# Patient Record
Sex: Male | Born: 1970 | Race: Black or African American | Hispanic: No | Marital: Single | Smoking: Current every day smoker
Health system: Southern US, Community
[De-identification: ages and names within clinical notes are randomized; demographics above are authoritative.]

## PROBLEM LIST (undated history)

## (undated) DIAGNOSIS — F1011 Alcohol abuse, in remission: Secondary | ICD-10-CM

## (undated) DIAGNOSIS — I10 Essential (primary) hypertension: Secondary | ICD-10-CM

---

## 1999-08-18 ENCOUNTER — Emergency Department (HOSPITAL_COMMUNITY): Admission: EM | Admit: 1999-08-18 | Discharge: 1999-08-18 | Payer: Self-pay | Admitting: Emergency Medicine

## 1999-08-18 ENCOUNTER — Encounter: Payer: Self-pay | Admitting: Emergency Medicine

## 2000-09-06 ENCOUNTER — Emergency Department (HOSPITAL_COMMUNITY): Admission: EM | Admit: 2000-09-06 | Discharge: 2000-09-06 | Payer: Self-pay | Admitting: Emergency Medicine

## 2001-12-01 ENCOUNTER — Emergency Department (HOSPITAL_COMMUNITY): Admission: EM | Admit: 2001-12-01 | Discharge: 2001-12-01 | Payer: Self-pay

## 2002-02-05 ENCOUNTER — Encounter: Payer: Self-pay | Admitting: Emergency Medicine

## 2002-02-05 ENCOUNTER — Emergency Department (HOSPITAL_COMMUNITY): Admission: EM | Admit: 2002-02-05 | Discharge: 2002-02-05 | Payer: Self-pay | Admitting: Emergency Medicine

## 2008-08-19 ENCOUNTER — Emergency Department (HOSPITAL_COMMUNITY): Admission: EM | Admit: 2008-08-19 | Discharge: 2008-08-19 | Payer: Self-pay | Admitting: Emergency Medicine

## 2011-02-02 ENCOUNTER — Emergency Department (HOSPITAL_COMMUNITY): Payer: Self-pay

## 2011-02-02 ENCOUNTER — Emergency Department (HOSPITAL_COMMUNITY)
Admission: EM | Admit: 2011-02-02 | Discharge: 2011-02-03 | Disposition: A | Discharge status: Home / Self Care | Payer: Self-pay | Attending: Emergency Medicine | Admitting: Emergency Medicine

## 2011-02-02 ENCOUNTER — Encounter (HOSPITAL_COMMUNITY): Payer: Self-pay

## 2011-02-02 DIAGNOSIS — R1012 Left upper quadrant pain: Secondary | ICD-10-CM | POA: Insufficient documentation

## 2011-02-02 DIAGNOSIS — I10 Essential (primary) hypertension: Secondary | ICD-10-CM | POA: Insufficient documentation

## 2011-02-02 DIAGNOSIS — R109 Unspecified abdominal pain: Secondary | ICD-10-CM | POA: Insufficient documentation

## 2011-02-02 HISTORY — DX: Essential (primary) hypertension: I10

## 2011-02-02 LAB — DIFFERENTIAL
Basophils Absolute: 0 10*3/uL (ref 0.0–0.1)
Basophils Relative: 0 % (ref 0–1)
Eosinophils Absolute: 0.1 10*3/uL (ref 0.0–0.7)
Eosinophils Relative: 1 % (ref 0–5)
Lymphocytes Relative: 32 % (ref 12–46)
Lymphs Abs: 2.5 10*3/uL (ref 0.7–4.0)
Monocytes Absolute: 0.9 10*3/uL (ref 0.1–1.0)
Monocytes Relative: 12 % (ref 3–12)
Neutro Abs: 4.3 10*3/uL (ref 1.7–7.7)
Neutrophils Relative %: 55 % (ref 43–77)

## 2011-02-02 LAB — COMPREHENSIVE METABOLIC PANEL
Albumin: 3.9 g/dL (ref 3.5–5.2)
Alkaline Phosphatase: 88 U/L (ref 39–117)
BUN: 8 mg/dL (ref 6–23)
Chloride: 103 mEq/L (ref 96–112)
Creatinine, Ser: 1.06 mg/dL (ref 0.4–1.5)
GFR calc non Af Amer: >60 mL/min (ref >60)
Glucose, Bld: 104 mg/dL — ABNORMAL HIGH (ref 70–99)
Potassium: 3.8 mEq/L (ref 3.5–5.1)
Total Bilirubin: 0.5 mg/dL (ref 0.3–1.2)

## 2011-02-02 LAB — CBC
HCT: 46.8 % (ref 39.0–52.0)
Hemoglobin: 16.4 g/dL (ref 13.0–17.0)
MCH: 32 pg (ref 26.0–34.0)
MCHC: 35 g/dL (ref 30.0–36.0)
MCV: 91.4 fL (ref 78.0–100.0)
Platelets: 219 10*3/uL (ref 150–400)
RBC: 5.12 MIL/uL (ref 4.22–5.81)
RDW: 13.4 % (ref 11.5–15.5)
WBC: 7.7 10*3/uL (ref 4.0–10.5)

## 2011-02-02 LAB — URINALYSIS, ROUTINE W REFLEX MICROSCOPIC
Ketones, ur: 15 mg/dL — AB
Nitrite: NEGATIVE
Protein, ur: NEGATIVE mg/dL
Urobilinogen, UA: 1 mg/dL (ref 0.0–1.0)

## 2011-02-02 LAB — LIPASE, BLOOD: Lipase: 45 U/L (ref 11–59)

## 2011-12-17 ENCOUNTER — Encounter (HOSPITAL_COMMUNITY): Payer: Self-pay | Admitting: Emergency Medicine

## 2011-12-17 DIAGNOSIS — Z0389 Encounter for observation for other suspected diseases and conditions ruled out: Secondary | ICD-10-CM | POA: Insufficient documentation

## 2011-12-17 NOTE — ED Notes (Signed)
PT. REPORTS ELEVATE BLOOD PRESSURE AND LEFT FACE SWELLING / PAIN LAST NIGHT , RAN OUT OF HYPERTENSION MEDICATIONS .

## 2011-12-18 ENCOUNTER — Emergency Department (HOSPITAL_COMMUNITY)
Admission: EM | Admit: 2011-12-18 | Discharge: 2011-12-18 | Discharge status: Against Medical Advice | Payer: Self-pay | Attending: Emergency Medicine | Admitting: Emergency Medicine

## 2013-06-17 ENCOUNTER — Emergency Department (HOSPITAL_COMMUNITY)
Admission: EM | Admit: 2013-06-17 | Discharge: 2013-06-17 | Disposition: A | Discharge status: Home / Self Care | Payer: Self-pay | Attending: Emergency Medicine | Admitting: Emergency Medicine

## 2013-06-17 ENCOUNTER — Emergency Department (HOSPITAL_COMMUNITY): Payer: Self-pay

## 2013-06-17 ENCOUNTER — Encounter (HOSPITAL_COMMUNITY): Payer: Self-pay | Admitting: Emergency Medicine

## 2013-06-17 DIAGNOSIS — I10 Essential (primary) hypertension: Secondary | ICD-10-CM | POA: Insufficient documentation

## 2013-06-17 DIAGNOSIS — I160 Hypertensive urgency: Secondary | ICD-10-CM

## 2013-06-17 DIAGNOSIS — Z79899 Other long term (current) drug therapy: Secondary | ICD-10-CM | POA: Insufficient documentation

## 2013-06-17 DIAGNOSIS — R112 Nausea with vomiting, unspecified: Secondary | ICD-10-CM | POA: Insufficient documentation

## 2013-06-17 DIAGNOSIS — F17200 Nicotine dependence, unspecified, uncomplicated: Secondary | ICD-10-CM | POA: Insufficient documentation

## 2013-06-17 LAB — CBC WITH DIFFERENTIAL/PLATELET
Basophils Absolute: 0 10*3/uL (ref 0.0–0.1)
Basophils Relative: 0 % (ref 0–1)
HCT: 47.3 % (ref 39.0–52.0)
Lymphocytes Relative: 21 % (ref 12–46)
MCHC: 35.1 g/dL (ref 30.0–36.0)
Monocytes Absolute: 0.8 10*3/uL (ref 0.1–1.0)
Neutro Abs: 5.7 10*3/uL (ref 1.7–7.7)
Neutrophils Relative %: 69 % (ref 43–77)
Platelets: 181 10*3/uL (ref 150–400)
RDW: 12.9 % (ref 11.5–15.5)
WBC: 8.3 10*3/uL (ref 4.0–10.5)

## 2013-06-17 LAB — URINALYSIS, ROUTINE W REFLEX MICROSCOPIC
Bilirubin Urine: NEGATIVE
Glucose, UA: NEGATIVE mg/dL
Hgb urine dipstick: NEGATIVE
Specific Gravity, Urine: 1.019 (ref 1.005–1.030)

## 2013-06-17 LAB — COMPREHENSIVE METABOLIC PANEL
ALT: 55 U/L — ABNORMAL HIGH (ref 0–53)
AST: 31 U/L (ref 0–37)
Albumin: 4.2 g/dL (ref 3.5–5.2)
Chloride: 99 mEq/L (ref 96–112)
Creatinine, Ser: 1.09 mg/dL (ref 0.50–1.35)
Sodium: 138 mEq/L (ref 135–145)
Total Bilirubin: 1 mg/dL (ref 0.3–1.2)

## 2013-06-17 LAB — LIPASE, BLOOD: Lipase: 32 U/L (ref 11–59)

## 2013-06-17 MED ORDER — HYDROCHLOROTHIAZIDE 25 MG PO TABS: 25.0000 mg | ORAL_TABLET | Freq: Every day | ORAL | Status: DC

## 2013-06-17 NOTE — ED Provider Notes (Signed)
History    CSN: [id number] Arrival date & time 06/17/13  1552  First MD Initiated Contact with Patient 06/17/13 1739     Chief Complaint  Patient presents with  . Emesis  . Headache    HPI  Patient presents with concerns of nausea, vomiting, headache, elevated blood pressure. He states that he has a history of hypertension, for which she is not currently taking his medication. He states that over the past days and weeks he has had multiple changes in his condition. Respiratory the patient is new nausea, headache that began approximately 3 days ago, subtly. Since onset symptoms have been persistent, without appreciable change from anything. The headache is right-sided, sore, nonradiating. There is no report of new disorientation, confusion, chest pain, dyspnea, syncope, near syncope. The patient did have multiple episodes of emesis 3 days ago, but none since that time.   Past Medical History  Diagnosis Date  . Hypertension    History reviewed. No pertinent past surgical history. History reviewed. No pertinent family history. History  Substance Use Topics  . Smoking status: Current Every Day Smoker  . Smokeless tobacco: Not on file  . Alcohol Use: Yes    Review of Systems  Constitutional:       Per HPI, otherwise negative  HENT:       Per HPI, otherwise negative  Respiratory:       Per HPI, otherwise negative  Cardiovascular:       Per HPI, otherwise negative  Gastrointestinal: Positive for nausea and vomiting.  Endocrine:       Negative aside from HPI  Genitourinary:       Neg aside from HPI   Musculoskeletal:       Per HPI, otherwise negative  Skin: Negative.   Neurological: Negative for syncope.    Allergies  Review of patient's allergies indicates no known allergies.  Home Medications   Current Outpatient Rx  Name  Route  Sig  Dispense  Refill  . ibuprofen (ADVIL,MOTRIN) 200 MG tablet   Oral   Take 200 mg by mouth every 6 (six) hours as needed for  pain.         . naproxen sodium (ANAPROX) 220 MG tablet   Oral   Take 220 mg by mouth 2 (two) times daily with a meal.         . hydrochlorothiazide (HYDRODIURIL) 25 MG tablet   Oral   Take 1 tablet (25 mg total) by mouth daily.   30 tablet   0    BP 143/93  Pulse 82  Temp(Src) 98.7 F (37.1 C) (Oral)  Resp 18  SpO2 97% Physical Exam  Nursing note and vitals reviewed. Constitutional: He is oriented to person, place, and time. He appears well-developed. No distress.  HENT:  Head: Normocephalic and atraumatic.  Eyes: Conjunctivae and EOM are normal.  Cardiovascular: Normal rate and regular rhythm.   Pulmonary/Chest: Effort normal. No stridor. No respiratory distress.  Abdominal: He exhibits no distension. There is no tenderness.  Musculoskeletal: He exhibits no edema.  Neurological: He is alert and oriented to person, place, and time.  Skin: Skin is warm and dry.  Psychiatric: He has a normal mood and affect.    ED Course  Procedures (including critical care time) Labs Reviewed  COMPREHENSIVE METABOLIC PANEL - Abnormal; Notable for the following:    Glucose, Bld 104 (*)    ALT 55 (*)    GFR calc non Af Amer 81 (*)  All other components within normal limits  URINALYSIS, ROUTINE W REFLEX MICROSCOPIC - Abnormal; Notable for the following:    APPearance CLOUDY (*)    All other components within normal limits  CBC WITH DIFFERENTIAL  LIPASE, BLOOD   Dg Chest 2 View  06/17/2013   *RADIOLOGY REPORT*  Clinical Data: Hypertensive crisis.  Nausea, vomiting.  Cough, congestion.  CHEST - 2 VIEW  Comparison: 02/02/2011  Findings: Heart and mediastinal contours are within normal limits. No focal opacities or effusions.  No acute bony abnormality.  IMPRESSION: No active cardiopulmonary disease.   Original Report Authenticated By: Charlett Nose, M.D.   1. Hypertensive urgency    Date: 06/17/2013  Rate: 83  Rhythm: normal sinus rhythm  QRS Axis: normal  Intervals: normal  ST/T  Wave abnormalities: nonspecific T wave changes  Conduction Disutrbances:left posterior fascicular block  Narrative Interpretation:   Old EKG Reviewed: none available ABNORMAL  7:52 PM We discussed all results, the need to initiate antihypertensive, and specifically the need to follow up with a physician. MDM  This generally well-appearing male presents with concern of hypertension, headache, nausea.  On exam he is neurologically intact, in no distress.  With his elevated blood pressure, there is some suspicion for hypertensive crisis, though labs, vital signs are somewhat reassuring.  The patient was started on blood pressure medication, and absent any evidence of distress, with appropriate for discharge with close outpatient followup.  Gerhard Munch, MD 06/17/13 [phone]

## 2013-06-17 NOTE — ED Notes (Signed)
[place]wood MD at bedside. 

## 2013-06-17 NOTE — ED Notes (Signed)
Pt c/o N/V, HA and abd pain x 2 days; pt sts out of BP meds x a long time

## 2014-07-20 ENCOUNTER — Encounter (HOSPITAL_COMMUNITY): Payer: Self-pay | Admitting: Emergency Medicine

## 2014-07-20 ENCOUNTER — Emergency Department (HOSPITAL_COMMUNITY)
Admission: EM | Admit: 2014-07-20 | Discharge: 2014-07-21 | Disposition: A | Discharge status: Home / Self Care | Payer: No Typology Code available for payment source | Attending: Emergency Medicine | Admitting: Emergency Medicine

## 2014-07-20 DIAGNOSIS — M79632 Pain in left forearm: Secondary | ICD-10-CM

## 2014-07-20 DIAGNOSIS — F17200 Nicotine dependence, unspecified, uncomplicated: Secondary | ICD-10-CM | POA: Insufficient documentation

## 2014-07-20 DIAGNOSIS — IMO0002 Reserved for concepts with insufficient information to code with codable children: Secondary | ICD-10-CM | POA: Insufficient documentation

## 2014-07-20 DIAGNOSIS — S4980XA Other specified injuries of shoulder and upper arm, unspecified arm, initial encounter: Secondary | ICD-10-CM | POA: Insufficient documentation

## 2014-07-20 DIAGNOSIS — M545 Low back pain, unspecified: Secondary | ICD-10-CM

## 2014-07-20 DIAGNOSIS — Y92410 Unspecified street and highway as the place of occurrence of the external cause: Secondary | ICD-10-CM | POA: Insufficient documentation

## 2014-07-20 DIAGNOSIS — S59919A Unspecified injury of unspecified forearm, initial encounter: Secondary | ICD-10-CM

## 2014-07-20 DIAGNOSIS — S46909A Unspecified injury of unspecified muscle, fascia and tendon at shoulder and upper arm level, unspecified arm, initial encounter: Secondary | ICD-10-CM | POA: Insufficient documentation

## 2014-07-20 DIAGNOSIS — S59909A Unspecified injury of unspecified elbow, initial encounter: Secondary | ICD-10-CM | POA: Insufficient documentation

## 2014-07-20 DIAGNOSIS — I10 Essential (primary) hypertension: Secondary | ICD-10-CM | POA: Insufficient documentation

## 2014-07-20 DIAGNOSIS — S6990XA Unspecified injury of unspecified wrist, hand and finger(s), initial encounter: Secondary | ICD-10-CM

## 2014-07-20 DIAGNOSIS — S43401A Unspecified sprain of right shoulder joint, initial encounter: Secondary | ICD-10-CM

## 2014-07-20 DIAGNOSIS — Y9389 Activity, other specified: Secondary | ICD-10-CM | POA: Insufficient documentation
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
## 2014-07-21 ENCOUNTER — Emergency Department (HOSPITAL_COMMUNITY): Payer: No Typology Code available for payment source

## 2014-07-21 MED ORDER — KETOROLAC TROMETHAMINE 30 MG/ML IJ SOLN
30.0000 mg | Freq: Once | INTRAMUSCULAR | Status: AC
  Administered 2014-07-21: 30 mg via INTRAMUSCULAR
  Filled 2014-07-21: qty 1

## 2014-07-21 NOTE — Discharge Instructions (Signed)
Return to the emergency room with worsening of symptoms or with symptoms that are concerning. Shoulder pain. Tylenol 2 500mg  tablets three times a day. Do not take more tylenol than this. You may add NSAIDs (motrin, ibuprofen, naproxen, aleve). Due to your stomach upset and diarrhea try taking with food. RICE: Rest, Ice, compression, elevation. Follow up with urgent care or PCP. Since patient does not have PCP, resources provided.   Emergency Department Resource Guide 1) Find a Doctor and Pay Out of Pocket Although you won't have to find out who is covered by your insurance plan, it is a good idea to ask around and get recommendations. You will then need to call the office and see if the doctor you have chosen will accept you as a new patient and what types of options they offer for patients who are self-pay. Some doctors offer discounts or will set up payment plans for their patients who do not have insurance, but you will need to ask so you aren't surprised when you get to your appointment.  2) Contact Your Local Health Department Not all health departments have doctors that can see patients for sick visits, but many do, so it is worth a call to see if yours does. If you don't know where your local health department is, you can check in your phone book. The CDC also has a tool to help you locate your state's health department, and many state websites also have listings of all of their local health departments.  3) Find a Walk-in Clinic If your illness is not likely to be very severe or complicated, you may want to try a walk in clinic. These are popping up all over the country in pharmacies, drugstores, and shopping centers. They're usually staffed by nurse practitioners or physician assistants that have been trained to treat common illnesses and complaints. They're usually fairly quick and inexpensive. However, if you have serious medical issues or chronic medical problems, these are probably not  your best option.  No Primary Care Doctor: - Call Health Connect at  [phone] - they can help you locate a primary care doctor that  accepts your insurance, provides certain services, etc. - Physician Referral Service- [phone]  Chronic Pain Problems: Organization         Address  Phone   Notes  [facility]  [phone] Patients need to be referred by their primary care doctor.   Medication Assistance: Organization         Address  Phone   Notes  [facility] Medication [facility] [address] [place] [place], [place] [address] [phone] --Must be a resident of [facility] -- Must have NO insurance coverage whatsoever (no Medicaid/ Medicare, etc.) -- The pt. MUST have a primary care doctor that directs their care regularly and follows them in the community   [facility]  [phone]   [employer]  [phone]    Agencies that provide inexpensive medical care: Organization         Address  Phone   Notes  [facility]  [phone]   [facility]    [phone]   [facility] [address] [place], [place] [address] [phone]   [facility] [address] [place] [place] [phone]   Planned Parenthood    [phone]   [facility]    [phone]   [facility]  [address], [place] Phone:  [phone], Fax:  [phone](336) [phone] Hours of Operation:  9 am - 6 pm, M-F.  Also accepts Medicaid/Medicare and self-pay.  [facility]  [address], [place] Phone: [phone](336) [phone], Fax: [phone]) [phone]. Hours of Operation:  8:30 am - 5:30 pm, M-F.  Also accepts Medicaid and self-pay.  [facility] [place] [address], [place] Phone: [phone](336) [phone]   [facility] [address], [place], [place] [phone], Ext. [phone] Mondays & Thursdays: 7-9 AM.  First  15 patients are seen on a first come, first serve basis.    Medicaid-accepting [facility] Providers:  Organization         Address  Phone   Notes  [facility] [address], [place] [phone] Also accepts self-pay patients.  [facility] [address] [place], [place]  [phone]) [phone]   [facility] [address], [place] [phone]   [facility] [address] [place] [address], [place] [phone]) [phone]   Renaye RakersVeita Bland [address], [place]   [phone] Only accepts [place] Access [place] patients after they have their name applied to their card.   Self-Pay (no insurance) in [facility]:  Organization         Address  Phone   Notes  Sickle Cell Patients, [facility] [address], [place] [phone]) [phone]   [facility] [address], [place] [phone]   [facility]  [address], [place] [phone]) [phone]   [facility]/Dr. Osei-Bonsu  [address] [place] [address] [place] [address], [place] or [phone], [place] [phone]) [phone] Phone number for both [place] and [place] locations is the same.  [facility] [address], [place] [phone]) [phone]   [facility] [place] [address] [place] [address], [place] or [address] [phone]   [facility] [address], [place] [phone](336) [phone], phone; [phone](336) [phone], fax Sees patients 1st and 3rd Saturday of every month.  Must not qualify for public or private insurance (i.e. Medicaid, Medicare, [place] Health Choice, Veterans' Benefits)  Household income should be no more than 200% of the poverty level The clinic cannot treat you if you are pregnant or think you are pregnant  Sexually transmitted diseases are not treated at the clinic.    Dental  Care: Organization         Address  Phone  Notes  [facility] [address], [place] [phone]) [phone] Accepts children up to age 45 who are enrolled in [place] or [place] Health Choice; pregnant women with a Medicaid card; and children who have applied for Medicaid or [place] Health Choice, but were declined, whose parents can pay a reduced fee at time of service.  [facility] [place]  [address], [place] [phone]) [phone] Accepts children up to age 45 who are enrolled in [place] or [place] Health Choice; pregnant women with a Medicaid card; and children who have applied for Medicaid or [place] Health Choice, but were declined, whose parents can pay a reduced fee at time of service.  [facility]  [address],  [place] [phone] Patients are seen by appointment only. Walk-ins are not accepted. [place] will see patients 51 years of age and older. Monday - Tuesday (8am-5pm) Most Wednesdays (8:30-5pm) $30 per visit, cash only  [facility]  [address], [facility] [phone] Patients are seen by appointment only. Walk-ins are not accepted. [place] will see patients 14 years of age and older. One Wednesday Evening (Monthly: Volunteer Based).  $30 per visit, cash only  [place]  [phone] for adults; Children under age 72, call Graduate Pediatric Dentistry at [phone]. Children aged 20-14, please call [phone] to request a pediatric application.  Dental services are provided in all areas of dental care including fillings, crowns and bridges, complete and partial dentures, implants, gum treatment, root canals, and extractions. Preventive care is also provided. Treatment is provided to both adults and children. Patients are selected via a lottery and there is often a waiting list.   [facility] [address], [place]  [phone] [email or web address]   [facility] [address] [place], [place] [phone], Ext. [phone] Second and Fourth Thursday of each month, opens at 6:30 AM; Clinic ends at 9 AM.  Patients are seen on a first-come first-served basis, and a limited number are seen during each clinic.   [facility]  [address] Hillard Danker [place], [place] [phone]   Eligibility Requirements You must have lived in [place], [place], or [place] counties for at least the last three months.   You cannot be eligible for state or federal sponsored [employer], including [employer], [place], or [employer].   You generally cannot be eligible for healthcare insurance through your employer.    How to apply: Eligibility screenings are held every Tuesday and Wednesday afternoon from 1:00 pm until 4:00 pm. You do not need an appointment for the interview!  [facility] [address] [place], [place]   [place]  [place] Department  [place]  [phone]    Behavioral Health Resources in the Community: Intensive Outpatient Programs Organization         Address  Phone  Notes  Jewett Whittemore. [address] [place], [place] [phone]   [facility] [address] [place], [place]   [facility] [address] [place], [place]   [place] [address]  [place], [place] or [phone]   Substance Abuse Resources Organization         Address  Phone  Notes  [facility] Drug Services  [phone]   [place]  [phone]   [facility] [place]   Chinita Pester  [phone]   Residential & Outpatient Substance Abuse Program  [phone])[phone]    Psychological Services Organization         Address  Phone  Notes  [facility] [place]  [place]  [phone]   [place] [address] [place] or [phone]    Mobile Crisis Teams Organization         Address  Phone  Notes  [facility], Mobile Crisis Care Unit  [phone]   [facility]  [address] [place], [place]   [place] [phone]  [address] °[place] [phone]   ° °Self-Help/Support Groups °Organization         Address  Phone             Notes  °[facility] [place] - variety of support groups  336- [phone] Call for more information  °Narcotics Anonymous (NA), [facility] [address], °[place]  2 meetings at this location  ° °Residential Treatment Programs °Organization         Address  Phone  Notes  °[facility] [address],    °[place]  [phone]   °[facility] ° [address], [place], [place] [phone]   °[facility] [address], [place] [phone] Admissions: 8am-3pm M-F  °[facility] [address]    °[place], [place] [phone]   °[facility] [address], [place], [place] [phone]   °[facility] [address]  °[place], [place] [phone]   °[facility] - Intensive Outpatient [address], [place], [place] [phone]   °[facility] [address]  °[place], [place] 1-877-615-2722 or [phone]   °[facility] ([facility]) [address] [place], [place] [phone] Accepts Medicaid  °[facility] [address]  °[place] 1-800-659-3381 Substance Abuse/Addiction Treatment  ° °[place] Behavioral Health Resources °Organization         Address  Phone  Notes  °[facility]  (888) [phone]   °Julie Brannon, PhD [address] [place], [place]   ([phone]) [phone] or (336) 951-0000   °[facility]   [address] °[place], [place] (336) 349-4454   °[facility] [address], [place], [place] (336) [phone] Insurance/Medicaid/sponsorship through [facility]  °[facility] [address]                                    [place], [place] ([phone]) [phone] Therapy/tele-psych/case  °[facility] [address]  ° [place], [place] (336) [phone]    °Dr. Arfeen  (336) [phone]   °[facility] [place]  [employer] [place] [facility] 1) [address],  °2) [address], [place] °3)  [address], [place] (336) [phone] °(336) [phone] ° °(336) [phone]   °[place] Child Abuse Hotline (336) 342-1394 or (336) 342-3537 (After Hours)    ° ° ° °

## 2014-07-21 NOTE — ED Provider Notes (Signed)
Medical screening examination/treatment/procedure(s) were conducted as a shared visit with non-physician practitioner(s) and myself.  I personally evaluated the patient during the encounter  Please see my separate respective documentation pertaining to this patient encounter   Vida RollerBrian D Asher Babilonia, MD 07/21/14 [phone]
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
## 2014-08-04 ENCOUNTER — Encounter (HOSPITAL_COMMUNITY): Payer: Self-pay | Admitting: Emergency Medicine

## 2014-08-04 ENCOUNTER — Emergency Department (HOSPITAL_COMMUNITY)
Admission: EM | Admit: 2014-08-04 | Discharge: 2014-08-04 | Disposition: A | Discharge status: Home / Self Care | Payer: No Typology Code available for payment source | Attending: Emergency Medicine | Admitting: Emergency Medicine

## 2014-08-04 DIAGNOSIS — T6391XA Toxic effect of contact with unspecified venomous animal, accidental (unintentional), initial encounter: Secondary | ICD-10-CM | POA: Diagnosis present

## 2014-08-04 DIAGNOSIS — Y929 Unspecified place or not applicable: Secondary | ICD-10-CM | POA: Insufficient documentation

## 2014-08-04 DIAGNOSIS — I10 Essential (primary) hypertension: Secondary | ICD-10-CM | POA: Insufficient documentation

## 2014-08-04 DIAGNOSIS — F17200 Nicotine dependence, unspecified, uncomplicated: Secondary | ICD-10-CM | POA: Insufficient documentation

## 2014-08-04 DIAGNOSIS — T63441A Toxic effect of venom of bees, accidental (unintentional), initial encounter: Secondary | ICD-10-CM

## 2014-08-04 DIAGNOSIS — Y939 Activity, unspecified: Secondary | ICD-10-CM | POA: Insufficient documentation

## 2014-08-04 DIAGNOSIS — T63461A Toxic effect of venom of wasps, accidental (unintentional), initial encounter: Secondary | ICD-10-CM | POA: Diagnosis not present

## 2014-08-04 MED ORDER — DIPHENHYDRAMINE HCL 50 MG/ML IJ SOLN
25.0000 mg | Freq: Once | INTRAMUSCULAR | Status: AC
  Administered 2014-08-04: 25 mg via INTRAMUSCULAR
  Filled 2014-08-04: qty 1

## 2014-08-04 MED ORDER — OXYCODONE-ACETAMINOPHEN 5-325 MG PO TABS
1.0000 | ORAL_TABLET | Freq: Once | ORAL | Status: AC
  Administered 2014-08-04: 1 via ORAL
  Filled 2014-08-04: qty 1

## 2014-08-04 MED ORDER — PREDNISONE 50 MG PO TABS
50.0000 mg | ORAL_TABLET | Freq: Every day | ORAL | Status: DC
Start: 2014-08-04 — End: 2015-06-07

## 2014-08-04 MED ORDER — PREDNISONE 20 MG PO TABS
60.0000 mg | ORAL_TABLET | Freq: Once | ORAL | Status: AC
  Administered 2014-08-04: 60 mg via ORAL
  Filled 2014-08-04: qty 3

## 2014-08-04 MED ORDER — DEXAMETHASONE SODIUM PHOSPHATE 10 MG/ML IJ SOLN: 10.0000 mg | Freq: Once | INTRAMUSCULAR | Status: DC

## 2014-08-04 MED ORDER — HYDROCODONE-ACETAMINOPHEN 5-325 MG PO TABS: 1.0000 | ORAL_TABLET | Freq: Four times a day (QID) | ORAL | Status: DC | PRN

## 2014-08-04 NOTE — Discharge Instructions (Signed)
Return here as needed.  Take Benadryl at home.  Ice to the area

## 2014-08-04 NOTE — ED Notes (Signed)
Declined W/C at D/C and was escorted to lobby by RN. 

## 2014-08-04 NOTE — ED Notes (Signed)
Pt states he was stung by bee to upper lip yesterday, reports increased swelling and discomfort, respirations unlabored. Speaking complete sentences. No signs of distress noted.

## 2014-08-04 NOTE — ED Provider Notes (Signed)
Medical screening examination/treatment/procedure(s) were performed by non-physician practitioner and as supervising physician I was immediately available for consultation/collaboration.   EKG Interpretation None        Malvin Morrish N Dixon Luczak, DO 08/04/14 1426 

## 2014-08-04 NOTE — ED Notes (Addendum)
Pt sts stung by bee yesterday on upper lip; pt here today with pain and swelling; no distress noted; pt sts hx of htn but denies any htn meds

## 2014-08-04 NOTE — ED Provider Notes (Signed)
CSN: [id number]     Arrival date & time 08/04/14  1137 History  This chart was scribed for non-physician practitioner Ebbie Ridge, PA-C working with Layla Maw Ward, DO by Leone Payor, ED Scribe. This patient was seen in room TR05C/TR05C and the patient's care was started at 1:18 PM.    Chief Complaint  Patient presents with  . Insect Bite   The history is provided by the patient. No language interpreter was used.    HPI Comments: Joseph Brady is a 43 y.o. male who presents to the Emergency Department complaining of a bee sting to left upper lip that occurred yesterday. He now has constant, unchanged swelling and pain to the upper lip. He did not try any home remedies for his symptoms. He denies SOB, oral swelling, difficulty swallowing.   Past Medical History  Diagnosis Date  . Hypertension    History reviewed. No pertinent past surgical history. History reviewed. No pertinent family history. History  Substance Use Topics  . Smoking status: Current Every Day Smoker  . Smokeless tobacco: Not on file  . Alcohol Use: Yes    Review of Systems  Respiratory: Negative for shortness of breath.   Skin:       +bee sting to upper lip  Neurological: Negative for numbness.      Allergies  Review of patient's allergies indicates no known allergies.  Home Medications   Prior to Admission medications   Not on File   BP 151/102  Pulse 95  Temp(Src) 98.9 F (37.2 C) (Oral)  Resp 20  SpO2 97% Physical Exam  Nursing note and vitals reviewed. Constitutional: He is oriented to person, place, and time. He appears well-developed and well-nourished.  HENT:  Head: Normocephalic and atraumatic.  Swelling to upper lip, left greater than right.   Cardiovascular: Normal rate.   Pulmonary/Chest: Effort normal.  Abdominal: He exhibits no distension.  Neurological: He is alert and oriented to person, place, and time.  Skin: Skin is warm and dry.  Psychiatric: He has a normal mood and  affect.    ED Course  Procedures (including critical care time)  DIAGNOSTIC STUDIES: Oxygen Saturation is 97% on RA, adequate by my interpretation.    COORDINATION OF CARE: 1:20 PM Discussed treatment plan with pt at bedside and pt agreed to plan.   I personally performed the services described in this documentation, which was scribed in my presence. The recorded information has been reviewed and is accurate.   Carlyle Dolly, PA-C 08/04/14 1419

## 2014-12-14 ENCOUNTER — Emergency Department (HOSPITAL_COMMUNITY)
Admission: EM | Admit: 2014-12-14 | Discharge: 2014-12-14 | Disposition: A | Discharge status: Home / Self Care | Payer: Self-pay | Attending: Emergency Medicine | Admitting: Emergency Medicine

## 2014-12-14 ENCOUNTER — Encounter (HOSPITAL_COMMUNITY): Payer: Self-pay | Admitting: Emergency Medicine

## 2014-12-14 DIAGNOSIS — F1210 Cannabis abuse, uncomplicated: Secondary | ICD-10-CM | POA: Diagnosis present

## 2014-12-14 DIAGNOSIS — F1994 Other psychoactive substance use, unspecified with psychoactive substance-induced mood disorder: Secondary | ICD-10-CM | POA: Diagnosis present

## 2014-12-14 DIAGNOSIS — Z720 Tobacco use: Secondary | ICD-10-CM | POA: Insufficient documentation

## 2014-12-14 DIAGNOSIS — F1014 Alcohol abuse with alcohol-induced mood disorder: Secondary | ICD-10-CM | POA: Insufficient documentation

## 2014-12-14 DIAGNOSIS — F12188 Cannabis abuse with other cannabis-induced disorder: Secondary | ICD-10-CM | POA: Insufficient documentation

## 2014-12-14 DIAGNOSIS — I10 Essential (primary) hypertension: Secondary | ICD-10-CM | POA: Insufficient documentation

## 2014-12-14 DIAGNOSIS — F1010 Alcohol abuse, uncomplicated: Secondary | ICD-10-CM | POA: Diagnosis present

## 2014-12-14 LAB — ETHANOL: Alcohol, Ethyl (B): 183 mg/dL — ABNORMAL HIGH (ref 0–9)

## 2014-12-14 LAB — I-STAT CHEM 8, ED
BUN: 7 mg/dL (ref 6–23)
Calcium, Ion: 1.08 mmol/L — ABNORMAL LOW (ref 1.12–1.23)
Chloride: 104 mEq/L (ref 96–112)
Creatinine, Ser: 1.3 mg/dL (ref 0.50–1.35)
Glucose, Bld: 104 mg/dL — ABNORMAL HIGH (ref 70–99)
HCT: 53 % — ABNORMAL HIGH (ref 39.0–52.0)
Hemoglobin: 18 g/dL — ABNORMAL HIGH (ref 13.0–17.0)
Potassium: 3.5 mmol/L (ref 3.5–5.1)
Sodium: 145 mmol/L (ref 135–145)
TCO2: 25 mmol/L (ref 0–100)

## 2014-12-14 MED ORDER — NICOTINE 21 MG/24HR TD PT24
21.0000 mg | MEDICATED_PATCH | Freq: Once | TRANSDERMAL | Status: DC
  Administered 2014-12-14: 21 mg via TRANSDERMAL
  Filled 2014-12-14: qty 1

## 2014-12-14 NOTE — ED Provider Notes (Signed)
CSN: [id number]     Arrival date & time 12/14/14  0403 History   First MD Initiated Contact with Patient 12/14/14 0600     Chief Complaint  Patient presents with  . Medical Clearance     (Consider location/radiation/quality/duration/timing/severity/associated sxs/prior Treatment) HPI Patient presents to the emergency department with suicidal thoughts and thoughts of harming his wife.  The patient states that he is having a lot of family problems and he had an episode last night where he thought about killing himself and his wife states that he has never had this kind of issue, and so it concerned him.  Patient tells me that he just needs a referral, but has to go to work today.  States that he does not want to lose his job.  Otherwise, the patient will give me any other further history or details Past Medical History  Diagnosis Date  . Hypertension    History reviewed. No pertinent past surgical history. History reviewed. No pertinent family history. History  Substance Use Topics  . Smoking status: Current Every Day Smoker  . Smokeless tobacco: Not on file  . Alcohol Use: Yes    Review of Systems Level V caveat applies due to uncooperativeness   Allergies  Review of patient's allergies indicates no known allergies.  Home Medications   Prior to Admission medications   Medication Sig Start Date End Date Taking? Authorizing Provider  HYDROcodone-acetaminophen (NORCO/VICODIN) 5-325 MG per tablet Take 1 tablet by mouth every 6 (six) hours as needed for moderate pain. Patient not taking: Reported on 12/14/2014 08/04/14   Jamesetta Orleans Anesia Blackwell, PA-C  predniSONE (DELTASONE) 50 MG tablet Take 1 tablet (50 mg total) by mouth daily. Patient not taking: Reported on 12/14/2014 08/04/14   Jamesetta Orleans Kathalina Ostermann, PA-C   BP 137/82 mmHg  Pulse 103  Temp(Src) 98 F (36.7 C) (Oral)  Resp 16  Ht  (1.778 m)  Wt 215 lb (97.523 kg)  BMI 30.85 kg/m2  SpO2 95% Physical Exam  Constitutional: He  is oriented to person, place, and time. He appears well-developed and well-nourished. No distress.  HENT:  Head: Normocephalic and atraumatic.  Mouth/Throat: Oropharynx is clear and moist.  Eyes: Pupils are equal, round, and reactive to light.  Neck: Normal range of motion. Neck supple.  Cardiovascular: Normal rate, regular rhythm and normal heart sounds.  Exam reveals no gallop and no friction rub.   No murmur heard. Pulmonary/Chest: Effort normal and breath sounds normal. No respiratory distress.  Neurological: He is alert and oriented to person, place, and time. He exhibits normal muscle tone. Coordination normal.  Skin: Skin is warm and dry. No rash noted. No erythema.  Nursing note and vitals reviewed.   ED Course  Procedures (including critical care time) Labs Review Labs Reviewed  I-STAT CHEM 8, ED - Abnormal; Notable for the following:    Glucose, Bld 104 (*)    Calcium, Ion 1.08 (*)    Hemoglobin 18.0 (*)    HCT 53.0 (*)    All other components within normal limits  URINE RAPID DRUG SCREEN (HOSP PERFORMED)  ETHANOL   Patient refused to speak with the TTS worker  She states that she will have psychiatry see him this morning   MDM   Final diagnoses:  None      Carlyle Dolly, PA-C 12/14/14 7829  Hanley Seamen, MD 12/14/14 [phone]

## 2014-12-14 NOTE — ED Notes (Signed)
Pt is awake and alert, pleasant and cooperative. Patient denies HI, SI AH or VH. Discharge vitals 151/89 HR 90 RR 16 and unlabored. Will continue to monitor for safety. Patient escorted to lobby without incident. T.Melvyn Neth RN

## 2014-12-14 NOTE — [facility] Suicide Risk Assessment (Signed)
Suicide Risk Assessment  Discharge Assessment     Demographic Factors:  Male  Total Time spent with patient: 45 minutes   Psychiatric Specialty Exam:     Blood pressure 136/79, pulse 97, temperature 98 F (36.7 C), temperature source Oral, resp. rate 16, height  (1.778 m), weight 215 lb (97.523 kg), SpO2 98 %.Body mass index is 30.85 kg/(m^2).  General Appearance: Casual  Eye Contact::  Good  Speech:  Normal Rate  Volume:  Normal  Mood:  Anxious  Affect:  Congruent  Thought Process:  Coherent  Orientation:  Full (Time, Place, and Person)  Thought Content:  WDL  Suicidal Thoughts:  No  Homicidal Thoughts:  No  Memory:  Immediate;   Good Recent;   Good Remote;   Good  Judgement:  Fair  Insight:  Fair  Psychomotor Activity:  Normal  Concentration:  Good  Recall:  Good  Fund of Knowledge:Good  Language: Good  Akathisia:  No  Handed:  Right  AIMS (if indicated):     Assets:  Architect Housing Intimacy Leisure Time Physical Health Resilience Social Support Talents/Skills Transportation Vocational/Educational  Sleep:      Musculoskeletal: Strength & Muscle Tone: within normal limits Gait & Station: normal Patient leans: N/A  Mental Status Per Nursing Assessment::   On Admission:   Alcohol abuse and argument with his wife  Current Mental Status by Physician: NA  Loss Factors: NA  Historical Factors: NA  Risk Reduction Factors:   Sense of responsibility to family, Employed, Living with another person, especially a relative and Positive social support  Continued Clinical Symptoms:  Little anxiety  Cognitive Features That Contribute To Risk:  None  Suicide Risk:  Minimal: No identifiable suicidal ideation.  Patients presenting with no risk factors but with morbid ruminations; may be classified as minimal risk based on the severity of the depressive symptoms  Discharge Diagnoses:   AXIS I:  Alcohol  Abuse'; marijuana abuse; substance induced mood disorder AXIS II:  Deferred AXIS III:   Past Medical History  Diagnosis Date  . Hypertension    AXIS IV:  other psychosocial or environmental problems, problems related to social environment and problems with primary support group AXIS V:  61-70 mild symptoms  Plan Of Care/Follow-up recommendations:  Activity:  as tolerated  Diet:  heart healthy diet  Is patient on multiple antipsychotic therapies at discharge:  No   Has Patient had three or more failed trials of antipsychotic monotherapy by history:  No  Recommended Plan for Multiple Antipsychotic Therapies: NA    Daxson Reffett, PMH-NP 12/14/2014, 12:16 PM

## 2014-12-14 NOTE — BH Assessment (Signed)
Tele Assessment Note   Joseph Brady is an 44 y.o. male presenting to [facility] ED reporting SI and HI. Pt stated "I just had some crazy thoughts, thoughts that I never had before". "I know I had to call somebody because I was going to hurt somebody". "I have been thinking about suicide and hurting somebody". Pt did not provide any further information and stated "I really want y'all to give me a referral for somebody I can talk to on the street because what I thought about tonight scared me". "I came here for a referral".  Pt is endorsing SI and HI but will not provide any additional details. Pt stated "I'm not going to answer all these questions, I just want a referral". Pt denies AVH at this time. Pt reported that he was drinking alcohol tonight but did not provide any additional details in regards to the amount of alcohol he consumed tonight. Pt report that he had an argument about money on tonight. Pt is alert and oriented x3. Pt is calm but uncooperative at this time. Pt mood is irritable and affect is congruent with mood. Pt maintained poor eye contact throughout this assessment and his speech is normal. Pt did not actively participate in this assessment and stated multiple times that he only wanted a referral to someone that he could talk to.   Axis I: Adjustment Disorder NOS  Past Medical History:  Past Medical History  Diagnosis Date  . Hypertension     History reviewed. No pertinent past surgical history.  Family History: History reviewed. No pertinent family history.  Social History:  reports that he has been smoking.  He does not have any smokeless tobacco history on file. He reports that he drinks alcohol. He reports that he does not use illicit drugs.  Additional Social History:     CIWA: CIWA-Ar BP: 137/82 mmHg Pulse Rate: 103 COWS:    PATIENT STRENGTHS: (choose at least two) Average or above average intelligence Motivation for treatment/growth  Allergies: No Known  Allergies  Home Medications:  (Not in a hospital admission)  OB/GYN Status:  No LMP for male patient.  General Assessment Data Location of Assessment: [facility] ED Is this a Tele or Face-to-Face Assessment?: Face-to-Face Is this an Initial Assessment or a Re-assessment for this encounter?: Initial Assessment Living Arrangements: Spouse/significant other Can pt return to current living arrangement?: Yes Admission Status: Voluntary Is patient capable of signing voluntary admission?: Yes Transfer from: Home Referral Source: Self/Family/Friend     [facility] Crisis Care Plan Living Arrangements: Spouse/significant other Name of Psychiatrist: No provider reported at this time.  Name of Therapist: No provider reported at this time.  Education Status Is patient currently in school?: No  Risk to self with the past 6 months Suicidal Ideation: Yes-Currently Present Suicidal Intent:  ("I'm not going to answer all these questions") Is patient at risk for suicide?: Yes Suicidal Plan?:  (Unable to assess) Access to Means:  (Unable to assess) What has been your use of drugs/alcohol within the last 12 months?: Pt reported that he had some alcohol last night; however did not provide any further details.  Previous Attempts/Gestures: No How many times?: 0 Other Self Harm Risks: No other self harm risk identified at this time.  Triggers for Past Attempts: None known Intentional Self Injurious Behavior: None Family Suicide History: No Recent stressful life event(s): Financial Problems Persecutory voices/beliefs?: No Depression:  (Unable to assess) Substance abuse history and/or treatment for substance abuse?: Yes Suicide prevention  information given to non-admitted patients: Not applicable  Risk to Others within the past 6 months Homicidal Ideation: Yes-Currently Present Thoughts of Harm to Others: Yes-Currently Present Comment - Thoughts of Harm to Others: Pt did not provide any additional  information. Current Homicidal Intent:  (Unable to assess) Current Homicidal Plan:  (Unable to assess ) Access to Homicidal Means:  (Unable to assess) Identified Victim:  (Unable to assess ) History of harm to others?:  (Unable to assess. ) Assessment of Violence: On admission Violent Behavior Description: No violent behaviors observed at this time.  Does patient have access to weapons?:  (Unable to assess) Criminal Charges Pending?:  (Unable to assess.) Does patient have a court date:  (Unable to assess )  Psychosis Hallucinations: None noted Delusions: None noted  Mental Status Report Appear/Hygiene: Unremarkable Eye Contact: Poor Motor Activity: Freedom of movement Speech: Logical/coherent Level of Consciousness: Alert Mood: Irritable Affect: Appropriate to circumstance Anxiety Level: Minimal Thought Processes: Coherent, Relevant Judgement: Partial Orientation: Unable to assess Obsessive Compulsive Thoughts/Behaviors: None  Cognitive Functioning Concentration: Fair Memory: Recent Intact IQ: Average Insight: Poor Impulse Control: Fair Appetite:  (Unable to assess) Weight Loss:  (Unable to assess.) Weight Gain:  (Unable to assess.) Sleep: Unable to Assess Vegetative Symptoms: Unable to Assess  ADLScreening [facility] [facility]) Patient's cognitive ability adequate to safely complete daily activities?: Yes Patient able to express need for assistance with ADLs?: Yes Independently performs ADLs?: Yes (appropriate for developmental age)  Prior Inpatient Therapy Prior Inpatient Therapy:  (Unable to assess )  Prior Outpatient Therapy Prior Outpatient Therapy:  (Unable to assess )  ADL Screening (condition at time of admission) Patient's cognitive ability adequate to safely complete daily activities?: Yes Patient able to express need for assistance with ADLs?: Yes Independently performs ADLs?: Yes (appropriate for developmental age)             Advance  Directives (For Healthcare) Does patient have an advance directive?: No Would patient like information on creating an advanced directive?: No - patient declined information    Additional Information 1:1 In Past 12 Months?: No CIRT Risk: No Elopement Risk: Yes     Disposition:  Disposition Initial Assessment Completed for this Encounter: Yes Disposition of Patient: Other dispositions Other disposition(s): Other (Comment) (Psychiatric evaluation. )  Akeema Broder S 12/14/2014 6:58 AM

## 2014-12-14 NOTE — Consult Note (Signed)
[facility] Face-to-Face Psychiatry Consult   Reason for Consult:  Alcohol intoxication, altercation with his wife Referring Physician:  EDP  Joseph Brady is an 44 y.o. male. Total Time spent with patient: 45 minutes  Assessment: AXIS I:  Alcohol Abuse, marijuana abuse; substance induced mood disorder AXIS II:  Deferred AXIS III:   Past Medical History  Diagnosis Date  . Hypertension    AXIS IV:  other psychosocial or environmental problems, problems related to social environment and problems with primary support group AXIS V:  61-70 mild symptoms  Plan:  No evidence of imminent risk to self or others at present.    Subjective:   Joseph Brady is a 44 y.o. male patient does not warrant admission.  HPI:  The patient was drinking alcohol last night and got into an argument with his wife.  Joseph Brady states, "I'm doing the best I can but never good enough (for his wife)".  His wife makes more money and Joseph Brady gives her his check but it seems to him it is never enough.  Last night they got into an argument after Joseph Brady had been drinking and in the heat of the moment Joseph Brady called the police.  Joseph Brady adamantly denies Joseph Brady had homicidal ideations or thoughts of hurting his wife, last night or today.  His wife will be notified prior to discharge.  Joseph Brady denies suicidal/homicidal ideations, hallucinations, and past psychiatric issues.  Joseph Brady wants to leave to go back to work at [employer] so Joseph Brady does not lose his job. HPI Elements:   Location:  generalized. Quality:  acute. Severity:  mild. Timing:  intermittent. Duration:  brief. Context:  drinking alcohol.  Past Psychiatric History: Past Medical History  Diagnosis Date  . Hypertension     reports that Joseph Brady has been smoking.  Joseph Brady does not have any smokeless tobacco history on file. Joseph Brady reports that Joseph Brady drinks alcohol. Joseph Brady reports that Joseph Brady does not use illicit drugs. History reviewed. No pertinent family history. Family History Substance Abuse:  (Unable to  assess) Family Supports:  (Unable to assess.) Living Arrangements: Spouse/significant other Can pt return to current living arrangement?: Yes   Allergies:  No Known Allergies  ACT Assessment Complete:  Yes:    Educational Status    Risk to Self: Risk to self with the past 6 months Suicidal Ideation: Yes-Currently Present Suicidal Intent:  ("I'm not going to answer all these questions") Is patient at risk for suicide?: Yes Suicidal Plan?:  (Unable to assess) Access to Means:  (Unable to assess) What has been your use of drugs/alcohol within the last 12 months?: Pt reported that Joseph Brady had some alcohol last night; however did not provide any further details.  Previous Attempts/Gestures: No How many times?: 0 Other Self Harm Risks: No other self harm risk identified at this time.  Triggers for Past Attempts: None known Intentional Self Injurious Behavior: None Family Suicide History: No Recent stressful life event(s): Financial Problems Persecutory voices/beliefs?: No Depression:  (Unable to assess) Substance abuse history and/or treatment for substance abuse?: Yes Suicide prevention information given to non-admitted patients: Not applicable  Risk to Others: Risk to Others within the past 6 months Homicidal Ideation: Yes-Currently Present Thoughts of Harm to Others: Yes-Currently Present Comment - Thoughts of Harm to Others: Pt did not provide any additional information. Current Homicidal Intent:  (Unable to assess) Current Homicidal Plan:  (Unable to assess ) Access to Homicidal Means:  (Unable to assess) Identified Victim:  (Unable to assess ) History  of harm to others?:  (Unable to assess. ) Assessment of Violence: On admission Violent Behavior Description: No violent behaviors observed at this time.  Does patient have access to weapons?:  (Unable to assess) Criminal Charges Pending?:  (Unable to assess.) Does patient have a court date:  (Unable to assess )  Abuse:    Prior  Inpatient Therapy: Prior Inpatient Therapy Prior Inpatient Therapy:  (Unable to assess )  Prior Outpatient Therapy: Prior Outpatient Therapy Prior Outpatient Therapy:  (Unable to assess )  Additional Information: Additional Information 1:1 In Past 12 Months?: No CIRT Risk: No Elopement Risk: Yes                  Objective: Blood pressure 136/79, pulse 97, temperature 98 F (36.7 C), temperature source Oral, resp. rate 16, height  (1.778 m), weight 215 lb (97.523 kg), SpO2 98 %.Body mass index is 30.85 kg/(m^2). Results for orders placed or performed during the hospital encounter of 12/14/14 (from the past 72 hour(s))  Ethanol     Status: Abnormal   Collection Time: 12/14/14  6:19 AM  Result Value Ref Range   Alcohol, Ethyl (B) 183 (H) 0 - 9 mg/dL    Comment:        LOWEST DETECTABLE LIMIT FOR SERUM ALCOHOL IS 11 mg/dL FOR MEDICAL PURPOSES ONLY   I-stat chem 8, ed     Status: Abnormal   Collection Time: 12/14/14  6:30 AM  Result Value Ref Range   Sodium 145 135 - 145 mmol/L   Potassium 3.5 3.5 - 5.1 mmol/L   Chloride 104 96 - 112 mEq/L   BUN 7 6 - 23 mg/dL   Creatinine, Ser 4.54 0.50 - 1.35 mg/dL   Glucose, Bld 098 (H) 70 - 99 mg/dL   Calcium, Ion 1.19 (L) 1.12 - 1.23 mmol/L   TCO2 25 0 - 100 mmol/L   Hemoglobin 18.0 (H) 13.0 - 17.0 g/dL   HCT 14.7 (H) 82.9 - 56.2 %   Labs are reviewed and are pertinent for no medical issues.  Current Facility-Administered Medications  Medication Dose Route Frequency Provider Last Rate Last Dose  . nicotine (NICODERM CQ - dosed in mg/24 hours) patch 21 mg  21 mg Transdermal Once Carlisle Beers Molpus, MD   21 mg at 12/14/14 0545   Current Outpatient Prescriptions  Medication Sig Dispense Refill  . HYDROcodone-acetaminophen (NORCO/VICODIN) 5-325 MG per tablet Take 1 tablet by mouth every 6 (six) hours as needed for moderate pain. (Patient not taking: Reported on 12/14/2014) 15 tablet 0  . predniSONE (DELTASONE) 50 MG tablet Take  1 tablet (50 mg total) by mouth daily. (Patient not taking: Reported on 12/14/2014) 5 tablet 0    Psychiatric Specialty Exam:     Blood pressure 136/79, pulse 97, temperature 98 F (36.7 C), temperature source Oral, resp. rate 16, height  (1.778 m), weight 215 lb (97.523 kg), SpO2 98 %.Body mass index is 30.85 kg/(m^2).  General Appearance: Casual  Eye Contact::  Good  Speech:  Normal Rate  Volume:  Normal  Mood:  Anxious  Affect:  Congruent  Thought Process:  Coherent  Orientation:  Full (Time, Place, and Person)  Thought Content:  WDL  Suicidal Thoughts:  No  Homicidal Thoughts:  No  Memory:  Immediate;   Good Recent;   Good Remote;   Good  Judgement:  Fair  Insight:  Fair  Psychomotor Activity:  Normal  Concentration:  Good  Recall:  Good  Fund  of Knowledge:Good  Language: Good  Akathisia:  No  Handed:  Right  AIMS (if indicated):     Assets:  Architect Housing Intimacy Leisure Time Physical Health Resilience Social Support Talents/Skills Transportation Vocational/Educational  Sleep:      Musculoskeletal: Strength & Muscle Tone: within normal limits Gait & Station: normal Patient leans: N/A  Treatment Plan Summary: Discharge home with follow-up resources.  Nanine Means, PMH-NP 12/14/2014 12:01 PM  I have personally seen the patient and agreed with the findings and involved in the treatment plan. Thresa Ross, MD

## 2014-12-14 NOTE — BHH Counselor (Signed)
Dr Gilmore Laroche rescinded patient's IVC. Writer placed original change of commitment form in [facility] IVC log and placed copy in pt's chart.  Evette Cristal, [place] Assessment Counselor

## 2014-12-14 NOTE — BH Assessment (Signed)
Consulted Maryjean Morn, PA-C who agrees that pt should be re-evaluated by psychiatry. Ebbie Ridge, PA-C has been informed of the recommendation.

## 2014-12-14 NOTE — ED Notes (Signed)
PT BIB [facility], states that he began thinking about killing himself and his "lady friend." Pt states "I thought about what I would do when she came into the room. Or I would just think, 'take those pills.' Just crazy stuff."  Pt tearful, initially resistant to speaking to Clinical research associate. Pt states "I hope I did the right thing. I just need a referral. I have to be at work at two today."

## 2015-06-07 ENCOUNTER — Encounter (HOSPITAL_COMMUNITY): Payer: Self-pay | Admitting: *Deleted

## 2015-06-07 ENCOUNTER — Emergency Department (HOSPITAL_COMMUNITY)
Admission: EM | Admit: 2015-06-07 | Discharge: 2015-06-07 | Disposition: A | Discharge status: Home / Self Care | Payer: No Typology Code available for payment source | Attending: Emergency Medicine | Admitting: Emergency Medicine

## 2015-06-07 DIAGNOSIS — Z7952 Long term (current) use of systemic steroids: Secondary | ICD-10-CM | POA: Insufficient documentation

## 2015-06-07 DIAGNOSIS — R860 Abnormal level of enzymes in specimens from male genital organs: Secondary | ICD-10-CM | POA: Insufficient documentation

## 2015-06-07 DIAGNOSIS — R55 Syncope and collapse: Secondary | ICD-10-CM

## 2015-06-07 DIAGNOSIS — N289 Disorder of kidney and ureter, unspecified: Secondary | ICD-10-CM

## 2015-06-07 DIAGNOSIS — Z720 Tobacco use: Secondary | ICD-10-CM | POA: Insufficient documentation

## 2015-06-07 DIAGNOSIS — E860 Dehydration: Secondary | ICD-10-CM

## 2015-06-07 DIAGNOSIS — I10 Essential (primary) hypertension: Secondary | ICD-10-CM | POA: Insufficient documentation

## 2015-06-07 LAB — BASIC METABOLIC PANEL
Anion gap: 16 — ABNORMAL HIGH (ref 5–15)
BUN: 16 mg/dL (ref 6–20)
CALCIUM: 9.5 mg/dL (ref 8.9–10.3)
CHLORIDE: 97 mmol/L — AB (ref 101–111)
CO2: 25 mmol/L (ref 22–32)
Creatinine, Ser: 2.27 mg/dL — ABNORMAL HIGH (ref 0.61–1.24)
GFR calc Af Amer: 38 mL/min — ABNORMAL LOW (ref >60)
GFR calc non Af Amer: 33 mL/min — ABNORMAL LOW (ref >60)
GLUCOSE: 153 mg/dL — AB (ref 65–99)
POTASSIUM: 3.2 mmol/L — AB (ref 3.5–5.1)
Sodium: 138 mmol/L (ref 135–145)

## 2015-06-07 LAB — CBC
HCT: 50.6 % (ref 39.0–52.0)
HEMOGLOBIN: 17.7 g/dL — AB (ref 13.0–17.0)
MCH: 32.7 pg (ref 26.0–34.0)
MCHC: 35 g/dL (ref 30.0–36.0)
MCV: 93.5 fL (ref 78.0–100.0)
PLATELETS: 203 10*3/uL (ref 150–400)
RBC: 5.41 MIL/uL (ref 4.22–5.81)
RDW: 12.8 % (ref 11.5–15.5)
WBC: 9.9 10*3/uL (ref 4.0–10.5)

## 2015-06-07 LAB — I-STAT CHEM 8, ED
BUN: 19 mg/dL (ref 6–20)
CREATININE: 2.4 mg/dL — AB (ref 0.61–1.24)
Calcium, Ion: 1.16 mmol/L (ref 1.12–1.23)
Chloride: 97 mmol/L — ABNORMAL LOW (ref 101–111)
GLUCOSE: 151 mg/dL — AB (ref 65–99)
HEMATOCRIT: 54 % — AB (ref 39.0–52.0)
Hemoglobin: 18.4 g/dL — ABNORMAL HIGH (ref 13.0–17.0)
Potassium: 3.1 mmol/L — ABNORMAL LOW (ref 3.5–5.1)
Sodium: 139 mmol/L (ref 135–145)
TCO2: 24 mmol/L (ref 0–100)

## 2015-06-07 LAB — CBG MONITORING, ED: GLUCOSE-CAPILLARY: 167 mg/dL — AB (ref 65–99)

## 2015-06-07 MED ORDER — MORPHINE SULFATE 4 MG/ML IJ SOLN
4.0000 mg | Freq: Once | INTRAMUSCULAR | Status: AC
  Administered 2015-06-07: 4 mg via INTRAVENOUS
  Filled 2015-06-07: qty 1

## 2015-06-07 MED ORDER — SODIUM CHLORIDE 0.9 % IV BOLUS (SEPSIS)
2000.0000 mL | Freq: Once | INTRAVENOUS | Status: AC
  Administered 2015-06-07: 2000 mL via INTRAVENOUS

## 2015-06-07 NOTE — ED Notes (Signed)
Md at bedside

## 2015-06-07 NOTE — ED Notes (Signed)
CBG 168. 

## 2015-06-07 NOTE — ED Provider Notes (Signed)
CSN: [id number]     Arrival date & time 06/07/15  0009 History  This chart was scribed for Azalia Bilis, MD by Merlene Laughter, ED Scribe. This patient was seen in room B18C/B18C and the patient's care was started at 1:21 AM.    Chief Complaint  Patient presents with  . Loss of Consciousness    The history is provided by the patient. No language interpreter was used.    HPI Comments: Joseph Brady is a 44 y.o. male with PMHx of HTN who presents to the Emergency Department complaining of headache onset 2 days ago with associated dizziness and lightheadedness worse when standing up.  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  Per wife, patient fell out of his chair onto the floor with episode of LOC that lasted a few minutes.  He also complains of associated intermittent chest tightness and SOB onset 2-3 days ago. Patient complains of associated pain to the right forehead but states that this is related to fall. Patient also states that he is thirsty, has not been urinating as frequently, and has weakness in his legs. He took tylenol for his headache with relief.  Patient denies associated fever, diarrhea, hematochezia, melena, abdominal pain and neck pain.  He has no PCP.     Past Medical History  Diagnosis Date  . Hypertension    History reviewed. No pertinent past surgical history. No family history on file. History  Substance Use Topics  . Smoking status: Current Every Day Smoker -- 0.50 packs/day    Types: Cigarettes  . Smokeless tobacco: Not on file  . Alcohol Use: 8.4 oz/week    14 Cans of beer per week    Review of Systems  A complete 10 system review of systems was obtained and all systems are negative except as noted in the HPI and PMH.    Allergies  Review of patient's allergies indicates no known allergies.  Home Medications   Prior to Admission medications   Medication Sig Start Date End Date Taking? Authorizing Provider  HYDROcodone-acetaminophen  (NORCO/VICODIN) 5-325 MG per tablet Take 1 tablet by mouth every 6 (six) hours as needed for moderate pain. Patient not taking: Reported on 12/14/2014 08/04/14   Charlestine Night, PA-C  predniSONE (DELTASONE) 50 MG tablet Take 1 tablet (50 mg total) by mouth daily. Patient not taking: Reported on 12/14/2014 08/04/14   Charlestine Night, PA-C   Triage Vitals: BP 84/60 mmHg  Pulse 108  Temp(Src) 97.3 F (36.3 C)  Resp 18  Ht 5\' 10"  (1.778 m)  Wt 209 lb (94.802 kg)  BMI 29.99 kg/m2  SpO2 98% Physical Exam  Constitutional: He is oriented to person, place, and time. He appears well-developed and well-nourished.  HENT:  Head: Normocephalic and atraumatic.  Eyes: EOM are normal.  Neck: Normal range of motion.  Cardiovascular: Normal rate, regular rhythm, normal heart sounds and intact distal pulses.   Pulmonary/Chest: Effort normal and breath sounds normal. No respiratory distress.  Abdominal: Soft. He exhibits no distension. There is no tenderness.  Musculoskeletal: Normal range of motion.  Neurological: He is alert and oriented to person, place, and time.  Skin: Skin is warm and dry.  Psychiatric: He has a normal mood and affect. Judgment normal.  Nursing note and vitals reviewed.   ED Course  Procedures  DIAGNOSTIC STUDIES: Oxygen Saturation is 98% on room air, normal by my interpretation.    COORDINATION OF CARE: 1:36 AM- Discussed plans to order  diagnostic lab work and EKG. Pt advised of plan for treatment, which includes IV fluid and medication for headache and pt agrees.  Labs Review Labs Reviewed  CBC - Abnormal; Notable for the following:    Hemoglobin 17.7 (*)    All other components within normal limits  BASIC METABOLIC PANEL - Abnormal; Notable for the following:    Potassium 3.2 (*)    Chloride 97 (*)    Glucose, Bld 153 (*)    Creatinine, Ser 2.27 (*)    GFR calc non Af Amer 33 (*)    GFR calc Af Amer 38 (*)    Anion gap 16 (*)    All other components within  normal limits  I-STAT CHEM 8, ED - Abnormal; Notable for the following:    Potassium 3.1 (*)    Chloride 97 (*)    Creatinine, Ser 2.40 (*)    Glucose, Bld 151 (*)    Hemoglobin 18.4 (*)    HCT 54.0 (*)    All other components within normal limits  CBG MONITORING, ED   BUN  Date Value Ref Range Status  06/07/2015 19 6 - 20 mg/dL Final  96/03/5408 16 6 - 20 mg/dL Final  81/19/1478 7 6 - 23 mg/dL Final  29/56/2130 10 6 - 23 mg/dL Final   CREATININE, SER  Date Value Ref Range Status  06/07/2015 2.40* 0.61 - 1.24 mg/dL Final  86/57/8469 6.29* 0.61 - 1.24 mg/dL Final  52/84/1324 4.01 0.50 - 1.35 mg/dL Final  02/72/5366 4.40 0.50 - 1.35 mg/dL Final       Imaging Review No results found.   EKG Interpretation   Date/Time:  Saturday June 07 2015 00:35:10 EDT Ventricular Rate:  107 PR Interval:  138 QRS Duration: 102 QT Interval:  372 QTC Calculation: 496 R Axis:   52 Text Interpretation:  Sinus tachycardia Inferior infarct , age  undetermined Cannot rule out Anterior infarct , age undetermined Abnormal  ECG No significant change was found Confirmed by Nhyira Leano  MD, Caryn Bee ([id number])  on 06/07/2015 1:33:54 AM      MDM   Final diagnoses:  Syncope, unspecified syncope type  Dehydration  Renal insufficiency    Minimal trauma from fall today.  I suspect that his CVP was secondary to dehydration.  No signs of infection.  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  He feels much better after IV fluids.  Ambulatory in the emergency department.  He does have some worsening renal insufficiency as compared to 5 months ago.  I do not think any supplies but a vas that he return the emergency department 36 hours for recheck of his creatinine and a repeat evaluation.  He understands to keep himself hydrated in the meantime  I personally performed the services described in this documentation, which was scribed in my presence. The recorded information has been reviewed and is accurate.        Azalia Bilis, MD 06/07/15 [phone]

## 2015-06-07 NOTE — Discharge Instructions (Signed)
Dehydration, Adult Dehydration is when you lose more fluids from the body than you take in. Vital organs like the kidneys, brain, and heart cannot function without a proper amount of fluids and salt. Any loss of fluids from the body can cause dehydration.  CAUSES   Vomiting.  Diarrhea.  Excessive sweating.  Excessive urine output.  Fever. SYMPTOMS  Mild dehydration  Thirst.  Dry lips.  Slightly dry mouth. Moderate dehydration  Very dry mouth.  Sunken eyes.  Skin does not bounce back quickly when lightly pinched and released.  Dark urine and decreased urine production.  Decreased tear production.  Headache. Severe dehydration  Very dry mouth.  Extreme thirst.  Rapid, weak pulse (more than 100 beats per minute at rest).  Cold hands and feet.  Not able to sweat in spite of heat and temperature.  Rapid breathing.  Blue lips.  Confusion and lethargy.  Difficulty being awakened.  Minimal urine production.  No tears. DIAGNOSIS  Your caregiver will diagnose dehydration based on your symptoms and your exam. Blood and urine tests will help confirm the diagnosis. The diagnostic evaluation should also identify the cause of dehydration. TREATMENT  Treatment of mild or moderate dehydration can often be done at home by increasing the amount of fluids that you drink. It is best to drink small amounts of fluid more often. Drinking too much at one time can make vomiting worse. Refer to the home care instructions below. Severe dehydration needs to be treated at the hospital where you will probably be given intravenous (IV) fluids that contain water and electrolytes. HOME CARE INSTRUCTIONS   Ask your caregiver about specific rehydration instructions.  Drink enough fluids to keep your urine clear or pale yellow.  Drink small amounts frequently if you have nausea and vomiting.  Eat as you normally do.  Avoid:  Foods or drinks high in sugar.  Carbonated  drinks.  Juice.  Extremely hot or cold fluids.  Drinks with caffeine.  Fatty, greasy foods.  Alcohol.  Tobacco.  Overeating.  Gelatin desserts.  Wash your hands well to avoid spreading bacteria and viruses.  Only take over-the-counter or prescription medicines for pain, discomfort, or fever as directed by your caregiver.  Ask your caregiver if you should continue all prescribed and over-the-counter medicines.  Keep all follow-up appointments with your caregiver. SEEK MEDICAL CARE IF:  You have abdominal pain and it increases or stays in one area (localizes).  You have a rash, stiff neck, or severe headache.  You are irritable, sleepy, or difficult to awaken.  You are weak, dizzy, or extremely thirsty. SEEK IMMEDIATE MEDICAL CARE IF:   You are unable to keep fluids down or you get worse despite treatment.  You have frequent episodes of vomiting or diarrhea.  You have blood or green matter (bile) in your vomit.  You have blood in your stool or your stool looks black and tarry.  You have not urinated in 6 to 8 hours, or you have only urinated a small amount of very dark urine.  You have a fever.  You faint. MAKE SURE YOU:   Understand these instructions.  Will watch your condition.  Will get help right away if you are not doing well or get worse. Document Released: 11/29/2005 Document Revised: 02/21/2012 Document Reviewed: 07/19/2011 ExitCare Patient Information 2015 [employer]. This information is not intended to replace advice given to you by your health care provider. Make sure you discuss any questions you have with your health care   provider.  

## 2015-06-07 NOTE — ED Notes (Signed)
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 States that he came home and passed out at a friends house. States he thinks he hit his head on something. Believes that he was unconscious for a couple minutes.

## 2015-06-08 ENCOUNTER — Encounter (HOSPITAL_COMMUNITY): Payer: Self-pay | Admitting: Emergency Medicine

## 2015-06-08 ENCOUNTER — Emergency Department (HOSPITAL_COMMUNITY)
Admission: EM | Admit: 2015-06-08 | Discharge: 2015-06-08 | Disposition: A | Discharge status: Home / Self Care | Payer: No Typology Code available for payment source | Attending: Emergency Medicine | Admitting: Emergency Medicine

## 2015-06-08 DIAGNOSIS — Z720 Tobacco use: Secondary | ICD-10-CM | POA: Insufficient documentation

## 2015-06-08 DIAGNOSIS — N289 Disorder of kidney and ureter, unspecified: Secondary | ICD-10-CM | POA: Insufficient documentation

## 2015-06-08 DIAGNOSIS — R6884 Jaw pain: Secondary | ICD-10-CM | POA: Insufficient documentation

## 2015-06-08 DIAGNOSIS — I10 Essential (primary) hypertension: Secondary | ICD-10-CM | POA: Insufficient documentation

## 2015-06-08 LAB — BASIC METABOLIC PANEL
ANION GAP: 8 (ref 5–15)
BUN: 10 mg/dL (ref 6–20)
CO2: 27 mmol/L (ref 22–32)
CREATININE: 1.12 mg/dL (ref 0.61–1.24)
Calcium: 8.5 mg/dL — ABNORMAL LOW (ref 8.9–10.3)
Chloride: 101 mmol/L (ref 101–111)
GFR calc Af Amer: >60 mL/min (ref >60)
GFR calc non Af Amer: >60 mL/min (ref >60)
GLUCOSE: 114 mg/dL — AB (ref 65–99)
Potassium: 3.9 mmol/L (ref 3.5–5.1)
Sodium: 136 mmol/L (ref 135–145)

## 2015-06-08 LAB — URINALYSIS, ROUTINE W REFLEX MICROSCOPIC
Bilirubin Urine: NEGATIVE
GLUCOSE, UA: NEGATIVE mg/dL
HGB URINE DIPSTICK: NEGATIVE
Ketones, ur: NEGATIVE mg/dL
LEUKOCYTES UA: NEGATIVE
Nitrite: NEGATIVE
PH: 6 (ref 5.0–8.0)
PROTEIN: NEGATIVE mg/dL
Specific Gravity, Urine: 1.025 (ref 1.005–1.030)
Urobilinogen, UA: 1 mg/dL (ref 0.0–1.0)

## 2015-06-08 NOTE — Discharge Instructions (Signed)
Please call your doctor for a followup appointment within 24-48 hours. When you talk to your doctor please let them know that you were seen in the emergency department and have them acquire all of your records so that they can discuss the findings with you and formulate a treatment plan to fully care for your new and ongoing problems.  Drink plenty of fluids - your kidney tests look better today than they did yesterday  RESOURCE GUIDE  Chronic Pain Problems: Contact [facility]  [phone] Patients need to be referred by their primary care doctor.  Insufficient Money for Medicine: Contact [employer]:  call "211."   No Primary Care Doctor: - Call Health Connect  [phone] - can help you locate a primary care doctor that  accepts your insurance, provides certain services, etc. - Physician Referral Service- [phone]  Agencies that provide inexpensive medical care: - [facility]  [phone] - [facility]  [phone] - [facility]  [phone] - Women's Clinic  [phone] - [facility]  [phone] [facility]  [phone]  Medicaid-accepting [facility] Providers: - [facility]- [address]  [phone], Mon-Fri 9am-7pm, Sat 9am-1pm - [facility]- [address] [place], [address] [place]  [phone] - [facility]- [address] [place]  [phone] [facility]- [address]  [phone] - Renaye Rakers- [address] [place], [address], [phone]  Only accepts [place] patients after they have their name  applied to their card  Self Pay (no insurance) in [place]: - Sickle Cell Patients: Dr Willey Blade, [facility]  [address] [place], [phone] - [facility]- [address] [place]  [phone]       [facility]- [address]       -      [facility]- see information above (Speak to [employer] if you do not have insurance)       -  [facility]- [address],  [phone]       -  [facility]- [address], [phone]       -  Dr Julio Sicks-  [address], [place], [phone]       -  [facility] - [address], [phone]       -  [facility]- [address] [phone], also [address] [phone]       -    [facility]- [address] [place], [phone], 1st & 3rd Saturday        every month, 10am-1pm  [facility] [address] [place], [place] [address] [phone]  [facility] [address] [place], [place] [address] [phone]  1) Find a Doctor and Pay Out of Pocket Although you won't have to find out who is covered by your insurance plan, it is a good idea to ask around and get recommendations. You will then need to call the office and see if the doctor you have chosen will accept you as a new patient and what types of options they offer for patients who are self-pay. Some doctors offer discounts or will set up payment plans for their patients who  do not have insurance, but you will need to ask so you aren't surprised when you get to your appointment.  2) Contact Your Local Health Department Not all health departments have doctors that can see patients for sick visits, but many do, so it is worth a call to see if yours does. If you don't know where your local health department is, you can check in your phone book. The CDC also has a tool to help you locate your state's health department, and many state websites also have listings of all of their local health departments.  3) Find a Walk-in Clinic If your illness is not likely to be very severe or complicated, you may want to try a walk in clinic. These are popping up all over the country in pharmacies, drugstores, and  shopping centers. They're usually staffed by nurse practitioners or physician assistants that have been trained to treat common illnesses and complaints. They're usually fairly quick and inexpensive. However, if you have serious medical issues or chronic medical problems, these are probably not your best option  STD Testing - [facility] Department of [facility], STD Clinic, [address], [place], phone [phone] or [phone].  Monday - Friday, call for an appointment. [facility] Department of [employer], STD Clinic, [address], [place], phone [phone] or [phone].  Monday - Friday, call for an appointment.  Abuse/Neglect: [facility] Child Abuse Hotline 4188492988 [facility] Child Abuse Hotline (631) 022-8443 (After Hours)  Emergency Shelter:  [facility] [phone]  Maternity Homes: - [facility] [place] [facility] [phone] - [facility] [phone]  MRSA Hotline #:   813-871-6826  Dental Assistance If unable to pay or uninsured, contact:  [facility]. to become qualified for the adult dental clinic.  Patients with Medicaid: [facility] [phone] [address], [phone] [address], [phone]  If unable to pay, or uninsured, contact [facility] [phone] in [place], [phone] in [facility]) to become qualified for the adult dental clinic  [facility] [address] [place], [place] [address] [phone] [email or web address]  Other Proofreader Services: - [facility]- [address] [place], [place], [place], [address], [phone], Ext. [phone], 2nd and 4th Thursday of the month at 6:30am.  10 clients each day by appointment, can sometimes see walk-in patients if someone does not show for an appointment. [facility]- [address] [place], [place], [address],  [phone] - [facility]- [address], [place], [place], [address], [phone] - [facility]- [phone] [facility]- [phone] [phone] -

## 2015-06-08 NOTE — ED Provider Notes (Signed)
CSN: [id number]     Arrival date & time 06/08/15  1275 History   First MD Initiated Contact with Patient 06/08/15 [phone]     Chief Complaint  Patient presents with  . Follow-up    returning for a recheck of kidney function     (Consider location/radiation/quality/duration/timing/severity/associated sxs/prior Treatment) HPI Comments: The patient is a 44 year old male, he has a history of hypertension, he states that he is supposed to be taking medication but he does not, over the last week he had a couple of episodes of loss of consciousness where he fell, one of those episodes he struck the right side of his face on the cement floor and was seen approximately 36 hours ago for this complaint, at the time the patient had no obvious signs of trauma, his workup was benign except for abnormal renal function with a creatinine of 2.3, he was asked to come back to the doctor or the emergency department to have this rechecked within 36 hours thus he presents today for that recheck. He states that he is still having pain in the right side of his face, it is mild, persistent, made worse with palpation, he does not have any pain with chewing, no nausea, no shortness of breath, no vomiting, no dysuria or diarrhea. 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  The history is provided by the patient.    Past Medical History  Diagnosis Date  . Hypertension    No past surgical history on file. No family history on file. History  Substance Use Topics  . Smoking status: Current Every Day Smoker -- 0.50 packs/day    Types: Cigarettes  . Smokeless tobacco: Not on file  . Alcohol Use: 8.4 oz/week    14 Cans of beer per week    Review of Systems  All other systems reviewed and are negative.     Allergies  Review of patient's allergies indicates no known allergies.  Home Medications    Prior to Admission medications   Medication Sig Start Date End Date Taking? Authorizing Provider  acetaminophen (TYLENOL) 500 MG tablet Take 1,000 mg by mouth every 6 (six) hours as needed for mild pain or moderate pain.   Yes Historical Provider, MD   BP 141/93 mmHg  Pulse 71  Temp(Src) 97.6 F (36.4 C) (Oral)  Resp 16  Ht 5\' 10"  (1.778 m)  Wt 209 lb (94.802 kg)  BMI 29.99 kg/m2  SpO2 97% Physical Exam  Constitutional: He appears well-developed and well-nourished. No distress.  HENT:  Head: Normocephalic and atraumatic.  Mouth/Throat: Oropharynx is clear and moist. No oropharyngeal exudate.  No swelling or bruising to the face, mild tenderness over the right zygomatic arch, no deformity, no malocclusion, no raccoon eyes, no battle sign  Eyes: Conjunctivae and EOM are normal. Pupils are equal, round, and reactive to light. Right eye exhibits no discharge. Left eye exhibits no discharge. No scleral icterus.  Neck: Normal range of motion. Neck supple. No JVD present. No thyromegaly present.  Cardiovascular: Normal rate, regular rhythm, normal heart sounds and intact distal pulses.  Exam reveals no gallop and no friction rub.   No murmur heard. Pulmonary/Chest: Effort normal and breath sounds normal. No respiratory distress. He has no wheezes. He has no rales.  Abdominal: Soft. Bowel sounds are normal. He exhibits no distension and no mass. There is no  tenderness.  Musculoskeletal: Normal range of motion. He exhibits no edema or tenderness.  Lymphadenopathy:    He has no cervical adenopathy.  Neurological: He is alert. Coordination normal.  Skin: Skin is warm and dry. No rash noted. No erythema.  Psychiatric: He has a normal mood and affect. His behavior is normal.  Nursing note and vitals reviewed.   ED Course  Procedures (including critical care time) Labs Review Labs Reviewed  BASIC METABOLIC PANEL - Abnormal; Notable for the following:    Glucose, Bld 114 (*)    Calcium  8.5 (*)    All other components within normal limits  URINALYSIS, ROUTINE W REFLEX MICROSCOPIC (NOT AT [facility])    Imaging Review No results found.    MDM   Final diagnoses:  Essential hypertension  Renal insufficiency    Other than mild facial tenderness the patient appears well appearing.  His vital signs are normal, pulses in the 60s, blood pressure is 130/80, we will obtain labs including urinalysis and basic metabolic panel to further evaluate the patient's renal function. He will likely need close follow-up if it is not worsened, will need to be more aggressive about hydration at home as the patient is not drinking enough fluids. I do not think that the traumatic injury to his face requires facial imaging as there is no deformity or bruising and normal facial symmetry without malocclusion, diplopia or extraocular entrapment. The patient is in agreement with the plan.  Urinalysis normal, no protein, no ketones, basic metabolic panel shows creatinine of 1.1, this is back to the patient's baseline from more than one year ago. The patient feels well, he has been informed of his hypertension and is agreeable to follow-up for recheck, no indication for new medications at this time. He has been given a list of follow-up physicians, he expresses his understanding.  Eber Hong, MD 06/08/15 [phone]

## 2015-06-08 NOTE — ED Notes (Signed)
Pain in face on right side from fall, seen here 2 nights ago for same, now returning as instructed for a recheck kidney function.  Was discharged with diagnosis of: SYNCOPE, UNSPECIFIED TYPE, DEHYDRATION, and RENAL INSUFFICIENCY.  States his face is starting to hurt worse.
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
## 2017-01-15 ENCOUNTER — Encounter (HOSPITAL_COMMUNITY): Payer: Self-pay

## 2017-01-15 ENCOUNTER — Emergency Department (HOSPITAL_COMMUNITY)
Admission: EM | Admit: 2017-01-15 | Discharge: 2017-01-15 | Disposition: A | Discharge status: Home / Self Care | Payer: No Typology Code available for payment source | Attending: Emergency Medicine | Admitting: Emergency Medicine

## 2017-01-15 DIAGNOSIS — Z79899 Other long term (current) drug therapy: Secondary | ICD-10-CM | POA: Insufficient documentation

## 2017-01-15 DIAGNOSIS — I10 Essential (primary) hypertension: Secondary | ICD-10-CM | POA: Insufficient documentation

## 2017-01-15 DIAGNOSIS — K047 Periapical abscess without sinus: Secondary | ICD-10-CM | POA: Insufficient documentation

## 2017-01-15 DIAGNOSIS — F17210 Nicotine dependence, cigarettes, uncomplicated: Secondary | ICD-10-CM | POA: Insufficient documentation

## 2017-01-15 DIAGNOSIS — K0889 Other specified disorders of teeth and supporting structures: Secondary | ICD-10-CM

## 2017-01-15 MED ORDER — OXYCODONE HCL 5 MG PO TABS: 5.0000 mg | ORAL_TABLET | Freq: Four times a day (QID) | ORAL | 0 refills | Status: DC | PRN

## 2017-01-15 MED ORDER — BUPIVACAINE-EPINEPHRINE (PF) 0.5% -1:200000 IJ SOLN
10.0000 mL | Freq: Once | INTRAMUSCULAR | Status: DC
  Filled 2017-01-15: qty 10

## 2017-01-15 MED ORDER — CLINDAMYCIN HCL 150 MG PO CAPS
600.0000 mg | ORAL_CAPSULE | Freq: Once | ORAL | Status: AC
  Administered 2017-01-15: 600 mg via ORAL
  Filled 2017-01-15: qty 4

## 2017-01-15 MED ORDER — CLINDAMYCIN HCL 150 MG PO CAPS: 450.0000 mg | ORAL_CAPSULE | Freq: Three times a day (TID) | ORAL | 0 refills | Status: DC

## 2017-01-15 MED ORDER — HYDROCHLOROTHIAZIDE 25 MG PO TABS: 25.0000 mg | ORAL_TABLET | Freq: Every day | ORAL | 0 refills | Status: DC

## 2017-01-15 MED ORDER — OXYCODONE HCL 5 MG PO TABS
10.0000 mg | ORAL_TABLET | Freq: Once | ORAL | Status: AC
  Administered 2017-01-15: 10 mg via ORAL
  Filled 2017-01-15: qty 2

## 2017-01-15 NOTE — Discharge Instructions (Signed)
1. Medications: Clindamycin, oxycodone, HCTZ, usual home medications 2. Treatment: rest, drink plenty of fluids, take medications as prescribed 3. Follow Up: Please followup with dentistry within 1 week for discussion of your diagnoses and further evaluation after today's visit; if you do not have a primary care doctor use the resource guide provided to find one; Return to the ER for high fevers, difficulty breathing, difficulty swallowing or other concerning symptoms

## 2017-01-15 NOTE — ED Notes (Signed)
ED Provider at bedside. 

## 2017-01-15 NOTE — ED Triage Notes (Signed)
Onset 2 weeks right lower tooth/gum pain.  Now right jaw is swollen and pain is causing ear pain and headache.

## 2017-01-15 NOTE — ED Provider Notes (Signed)
[facility] DEPT Provider Note   CSN: [id number] Arrival date & time: 01/15/17  1926     History   Chief Complaint Chief Complaint  Patient presents with  . Dental Pain  . Headache    HPI HA PLACERES is a 46 y.o. male with a hx of Hypertension presents to the Emergency Department complaining of gradual, persistent, progressively worsening right sided dental pain and jaw swelling onset 2 weeks ago.  Pt reports he had a filling that fell out and he has had progressive swelling of the soft tissue.  Associated symptoms include intermittent generalized headaches. Pt has used orajel and hydrogen peroxide without relief.  Pt does not have a dentist or PCP.  Eating and drinking makes it worse.  Pt denies fever, chills, neck pain, difficulty swallowing, tongue swelling, N/V/D.  Pt's BP has been uncontrolled for the last 2 years.     The history is provided by the patient and medical records. No language interpreter was used.    Past Medical History:  Diagnosis Date  . Hypertension     Patient Active Problem List   Diagnosis Date Noted  . Alcohol abuse 12/14/2014  . Marijuana abuse 12/14/2014  . Substance induced mood disorder (HCC) 12/14/2014    History reviewed. No pertinent surgical history.     Home Medications    Prior to Admission medications   Medication Sig Start Date End Date Taking? Authorizing Provider  acetaminophen (TYLENOL) 500 MG tablet Take 1,000 mg by mouth every 6 (six) hours as needed for mild pain or moderate pain.    Historical Provider, MD  clindamycin (CLEOCIN) 150 MG capsule Take 3 capsules (450 mg total) by mouth 3 (three) times daily. 01/15/17   Saurav Crumble, PA-C  hydrochlorothiazide (HYDRODIURIL) 25 MG tablet Take 1 tablet (25 mg total) by mouth daily. 01/15/17   Devinn Voshell, PA-C  oxyCODONE (ROXICODONE) 5 MG immediate release tablet Take 1 tablet (5 mg total) by mouth every 6 (six) hours as needed for severe pain. 01/15/17   Dahlia Client  Brynna Dobos, PA-C    Family History History reviewed. No pertinent family history.  Social History Social History  Substance Use Topics  . Smoking status: Current Every Day Smoker    Packs/day: 0.50    Types: Cigarettes  . Smokeless tobacco: Never Used  . Alcohol use 8.4 oz/week    14 Cans of beer per week     Allergies   Patient has no known allergies.   Review of Systems Review of Systems  HENT: Positive for dental problem and facial swelling.   Neurological: Positive for headaches.  All other systems reviewed and are negative.    Physical Exam Updated Vital Signs BP (!) 157/102   Pulse 94   Temp 98.3 F (36.8 C) (Oral)   Resp 18   Ht 5\' 10"  (1.778 m)   Wt 101.6 kg   SpO2 97%   BMI 32.13 kg/m   Physical Exam  Constitutional: He appears well-developed and well-nourished. No distress.  Awake, alert, nontoxic appearance  HENT:  Head: Normocephalic and atraumatic.  Right Ear: Tympanic membrane, external ear and ear canal normal.  Left Ear: Tympanic membrane, external ear and ear canal normal.  Nose: Nose normal. Right sinus exhibits no maxillary sinus tenderness and no frontal sinus tenderness. Left sinus exhibits no maxillary sinus tenderness and no frontal sinus tenderness.  Mouth/Throat: Uvula is midline, oropharynx is clear and moist and mucous membranes are normal. No oral lesions. Abnormal dentition. Dental caries present.  No uvula swelling or lacerations. No oropharyngeal exudate, posterior oropharyngeal edema, posterior oropharyngeal erythema or tonsillar abscesses.  Tooth # 29 absent Large abscess on the lateral side of the gingiva No fluctuance or induration to the buccal mucosa or floor of the mouth  Eyes: Conjunctivae are normal. Pupils are equal, round, and reactive to light. Right eye exhibits no discharge. Left eye exhibits no discharge. No scleral icterus.  Neck: Normal range of motion. Neck supple.  No stridor Handling secretions without  difficulty No nuchal rigidity Occipital lymphadenopathy; No cervical lymphadenopathy  Cardiovascular: Normal rate, regular rhythm, normal heart sounds and intact distal pulses.   Pulmonary/Chest: Effort normal and breath sounds normal. No respiratory distress. He has no wheezes.  Equal chest rise  Abdominal: Soft. Bowel sounds are normal. He exhibits no distension and no mass. There is no tenderness. There is no rebound and no guarding.  Musculoskeletal: Normal range of motion. He exhibits no edema.  Lymphadenopathy:       Head (right side): Submental and submandibular adenopathy present. No tonsillar, no preauricular, no posterior auricular and no occipital adenopathy present.       Head (left side): No submental, no submandibular, no tonsillar, no preauricular, no posterior auricular and no occipital adenopathy present.    He has no cervical adenopathy.  Neurological: He is alert.  Speech is clear and goal oriented Moves extremities without ataxia  Skin: Skin is warm and dry. He is not diaphoretic.  Psychiatric: He has a normal mood and affect.  Nursing note and vitals reviewed.    ED Treatments / Results   Procedures .Marland KitchenIncision and Drainage Date/Time: 01/15/2017 10:31 PM Performed by: Dierdre Forth Authorized by: Dierdre Forth   Consent:    Consent obtained:  Verbal   Consent given by:  Patient   Risks discussed:  Bleeding, incomplete drainage and infection   Alternatives discussed:  No treatment and alternative treatment (abx only) Location:    Type:  Abscess   Location:  Mouth   Mouth location:  Alveolar process Anesthesia (see MAR for exact dosages):    Anesthesia method:  Local infiltration   Local anesthetic:  Lidocaine 1% WITH epi (5ml) Procedure type:    Complexity:  Complex Procedure details:    Needle aspiration: no     Incision types:  Single straight   Scalpel blade:  11   Wound management:  Probed and deloculated and irrigated with saline    Drainage:  Bloody and purulent   Drainage amount:  Copious   Wound treatment:  Wound left open   Packing materials:  None Post-procedure details:    Patient tolerance of procedure:  Tolerated well, no immediate complications   (including critical care time)  Medications Ordered in ED Medications  bupivacaine-epinephrine (MARCAINE W/ EPI) 0.5% -1:200000 injection 10 mL (not administered)  clindamycin (CLEOCIN) capsule 600 mg (not administered)  oxyCODONE (Oxy IR/ROXICODONE) immediate release tablet 10 mg (10 mg Oral Given 01/15/17 2203)     Initial Impression / Assessment and Plan / ED Course  I have reviewed the triage vital signs and the nursing notes.  Pertinent labs & imaging results that were available during my care of the patient were reviewed by me and considered in my medical decision making (see chart for details).  Clinical Course as of Jan 16 2244  Sat Jan 15, 2017  2107 Pt hypertensive; hx of same BP: (!) 155/113 [HM]  2242 [place] narcotic database accessed. Pt without narcotic prescription in the last 6 months.  [  HM]    Clinical Course User Index [HM] Dierdre ForthHannah Cale [place], PA-C    Patient with toothache.  Gross abscess with I&D.  Exam unconcerning for Ludwig's angina or spread of infection.  Will treat with clindamycin and pain medicine.  Urged patient to follow-up with dentist.    Patient noted to be hypertensive in the emergency department.  No signs of hypertensive urgency.  Discussed with patient the need for close follow-up and management by their primary care physician. Patient will be started on antihypertensive.   Final Clinical Impressions(s) / ED Diagnoses   Final diagnoses:  Pain, dental  Dental abscess  Essential hypertension    New Prescriptions New Prescriptions   CLINDAMYCIN (CLEOCIN) 150 MG CAPSULE    Take 3 capsules (450 mg total) by mouth 3 (three) times daily.   HYDROCHLOROTHIAZIDE (HYDRODIURIL) 25 MG TABLET    Take 1 tablet (25 mg total) by  mouth daily.   OXYCODONE (ROXICODONE) 5 MG IMMEDIATE RELEASE TABLET    Take 1 tablet (5 mg total) by mouth every 6 (six) hours as needed for severe pain.     Dahlia ClientHannah Demiyah Fischbach, PA-C 01/15/17 2246    Eber HongBrian Miller, MD 01/26/17 1026

## 2017-04-09 ENCOUNTER — Emergency Department (HOSPITAL_COMMUNITY): Payer: No Typology Code available for payment source

## 2017-04-09 ENCOUNTER — Encounter (HOSPITAL_COMMUNITY): Payer: Self-pay | Admitting: Emergency Medicine

## 2017-04-09 ENCOUNTER — Emergency Department (HOSPITAL_COMMUNITY)
Admission: EM | Admit: 2017-04-09 | Discharge: 2017-04-09 | Disposition: A | Discharge status: Home / Self Care | Payer: No Typology Code available for payment source | Attending: Emergency Medicine | Admitting: Emergency Medicine

## 2017-04-09 DIAGNOSIS — Z79899 Other long term (current) drug therapy: Secondary | ICD-10-CM | POA: Diagnosis not present

## 2017-04-09 DIAGNOSIS — Y9389 Activity, other specified: Secondary | ICD-10-CM | POA: Diagnosis not present

## 2017-04-09 DIAGNOSIS — F17210 Nicotine dependence, cigarettes, uncomplicated: Secondary | ICD-10-CM | POA: Diagnosis not present

## 2017-04-09 DIAGNOSIS — S99922A Unspecified injury of left foot, initial encounter: Secondary | ICD-10-CM | POA: Diagnosis present

## 2017-04-09 DIAGNOSIS — S82445A Nondisplaced spiral fracture of shaft of left fibula, initial encounter for closed fracture: Secondary | ICD-10-CM | POA: Diagnosis not present

## 2017-04-09 DIAGNOSIS — Y999 Unspecified external cause status: Secondary | ICD-10-CM | POA: Diagnosis not present

## 2017-04-09 DIAGNOSIS — I10 Essential (primary) hypertension: Secondary | ICD-10-CM | POA: Diagnosis not present

## 2017-04-09 DIAGNOSIS — Y92410 Unspecified street and highway as the place of occurrence of the external cause: Secondary | ICD-10-CM | POA: Diagnosis not present

## 2017-04-09 MED ORDER — OXYCODONE-ACETAMINOPHEN 5-325 MG PO TABS: 1.0000 | ORAL_TABLET | ORAL | 0 refills | Status: DC | PRN

## 2017-04-09 NOTE — ED Notes (Signed)
Patient transported to X-ray 

## 2017-04-09 NOTE — Discharge Instructions (Signed)
Do not put weight on your left foot.  Keep your foot elevated to decrease swelling.

## 2017-04-09 NOTE — ED Triage Notes (Signed)
Pt states he ran out of his BP meds a week ago
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
## 2017-04-11 ENCOUNTER — Ambulatory Visit (INDEPENDENT_AMBULATORY_CARE_PROVIDER_SITE_OTHER): Payer: Self-pay | Admitting: Orthopaedic Surgery

## 2017-04-11 ENCOUNTER — Ambulatory Visit (INDEPENDENT_AMBULATORY_CARE_PROVIDER_SITE_OTHER): Payer: Self-pay

## 2017-04-11 ENCOUNTER — Encounter (HOSPITAL_BASED_OUTPATIENT_CLINIC_OR_DEPARTMENT_OTHER): Payer: Self-pay | Admitting: *Deleted

## 2017-04-11 ENCOUNTER — Other Ambulatory Visit (INDEPENDENT_AMBULATORY_CARE_PROVIDER_SITE_OTHER): Payer: Self-pay | Admitting: Orthopaedic Surgery

## 2017-04-11 DIAGNOSIS — S82832A Other fracture of upper and lower end of left fibula, initial encounter for closed fracture: Secondary | ICD-10-CM | POA: Insufficient documentation

## 2017-04-11 DIAGNOSIS — S8261XA Displaced fracture of lateral malleolus of right fibula, initial encounter for closed fracture: Secondary | ICD-10-CM
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
## 2017-04-12 ENCOUNTER — Ambulatory Visit (INDEPENDENT_AMBULATORY_CARE_PROVIDER_SITE_OTHER): Payer: Self-pay | Admitting: Orthopaedic Surgery

## 2017-04-13 ENCOUNTER — Ambulatory Visit (HOSPITAL_BASED_OUTPATIENT_CLINIC_OR_DEPARTMENT_OTHER)
Admission: RE | Admit: 2017-04-13 | Payer: No Typology Code available for payment source | Source: Ambulatory Visit | Admitting: Orthopaedic Surgery

## 2017-04-13 ENCOUNTER — Encounter (HOSPITAL_BASED_OUTPATIENT_CLINIC_OR_DEPARTMENT_OTHER): Payer: Self-pay | Admitting: Anesthesiology

## 2017-04-13 HISTORY — DX: Alcohol abuse, in remission: F10.11

## 2017-04-13 SURGERY — OPEN REDUCTION INTERNAL FIXATION (ORIF) ANKLE FRACTURE
Anesthesia: General | Site: Ankle | Laterality: Left

## 2017-04-13 MED ORDER — LIDOCAINE 2% (20 MG/ML) 5 ML SYRINGE
INTRAMUSCULAR | Status: AC
  Filled 2017-04-13: qty 5

## 2017-04-13 MED ORDER — PROPOFOL 10 MG/ML IV BOLUS
INTRAVENOUS | Status: AC
  Filled 2017-04-13: qty 20

## 2017-04-13 MED ORDER — DEXAMETHASONE SODIUM PHOSPHATE 10 MG/ML IJ SOLN
INTRAMUSCULAR | Status: AC
  Filled 2017-04-13: qty 1

## 2017-04-13 MED ORDER — ONDANSETRON HCL 4 MG/2ML IJ SOLN
INTRAMUSCULAR | Status: AC
  Filled 2017-04-13: qty 2

## 2017-04-26 ENCOUNTER — Inpatient Hospital Stay (INDEPENDENT_AMBULATORY_CARE_PROVIDER_SITE_OTHER): Payer: Self-pay | Admitting: Orthopaedic Surgery

## 2017-09-20 ENCOUNTER — Emergency Department (HOSPITAL_COMMUNITY)
Admission: EM | Admit: 2017-09-20 | Discharge: 2017-09-20 | Disposition: A | Discharge status: Home / Self Care | Payer: No Typology Code available for payment source | Attending: Emergency Medicine | Admitting: Emergency Medicine

## 2017-09-20 ENCOUNTER — Encounter (HOSPITAL_COMMUNITY): Payer: Self-pay | Admitting: *Deleted

## 2017-09-20 DIAGNOSIS — K047 Periapical abscess without sinus: Secondary | ICD-10-CM | POA: Insufficient documentation

## 2017-09-20 DIAGNOSIS — I10 Essential (primary) hypertension: Secondary | ICD-10-CM | POA: Insufficient documentation

## 2017-09-20 DIAGNOSIS — R220 Localized swelling, mass and lump, head: Secondary | ICD-10-CM

## 2017-09-20 DIAGNOSIS — F17210 Nicotine dependence, cigarettes, uncomplicated: Secondary | ICD-10-CM | POA: Insufficient documentation

## 2017-09-20 MED ORDER — PENICILLIN V POTASSIUM 250 MG PO TABS: 250.0000 mg | ORAL_TABLET | Freq: Four times a day (QID) | ORAL | 0 refills | Status: AC

## 2017-09-20 MED ORDER — HYDROCODONE-ACETAMINOPHEN 5-325 MG PO TABS: 1.0000 | ORAL_TABLET | Freq: Four times a day (QID) | ORAL | 0 refills | Status: DC | PRN

## 2017-09-20 MED ORDER — BUPIVACAINE-EPINEPHRINE (PF) 0.5% -1:200000 IJ SOLN
1.8000 mL | Freq: Once | INTRAMUSCULAR | Status: AC
  Administered 2017-09-20: 1.8 mL
  Filled 2017-09-20: qty 1.8

## 2017-09-20 NOTE — ED Triage Notes (Signed)
Pt reports recent dental infection, having right side jaw pain with swelling into right side of face and into his eye and head. Denies swelling and pain to throat. Airway intact at triage.

## 2017-09-20 NOTE — ED Provider Notes (Signed)
[facility]EM[facility] DEPT Provider Note   CSN: [id number] Arrival date & time: 09/20/17  0919     History   Chief Complaint Chief Complaint  Patient presents with  . Headache  . Facial Swelling    HPI Joseph Brady is a 46 y.o. male.  Swelling of the right lower jaw with pain radiating up the face.  No fever or systemic sx.  Severe pain when trying to eat but no swallowing difficulty   The history is provided by the patient.  Dental Pain   This is a recurrent problem. The current episode started 2 days ago. The problem occurs constantly. The problem has been gradually worsening. The pain is at a severity of 6/10. The pain is moderate. He has tried nothing for the symptoms. The treatment provided no relief.    Past Medical History:  Diagnosis Date  . H/O ETOH abuse   . Hypertension     Patient Active Problem List   Diagnosis Date Noted  . Closed fracture of left distal fibula 04/11/2017  . Alcohol abuse 12/14/2014  . Marijuana abuse 12/14/2014  . Substance induced mood disorder (HCC) 12/14/2014    History reviewed. No pertinent surgical history.     Home Medications    Prior to Admission medications   Medication Sig Start Date End Date Taking? Authorizing Provider  acetaminophen (TYLENOL) 500 MG tablet Take 1,000 mg by mouth every 6 (six) hours as needed for mild pain or moderate pain.    [provider]    Family History History reviewed. No pertinent family history.  Social History Social History  Substance Use Topics  . Smoking status: Current Every Day Smoker    Packs/day: 0.25    Types: Cigarettes  . Smokeless tobacco: Never Used  . Alcohol use 8.4 oz/week    14 Cans of beer per week     Allergies   Patient has no known allergies.   Review of Systems Review of Systems  All other systems reviewed and are negative.    Physical Exam Updated Vital Signs BP (!) 141/90 (BP Location: Right Arm)   Pulse 75   Temp 98.3 F (36.8 C)  (Oral)   Resp 18   SpO2 99%   Physical Exam  Constitutional: He is oriented to person, place, and time. He appears well-developed and well-nourished. No distress.  HENT:  Head: Normocephalic.    Mouth/Throat: Oropharynx is clear and moist and mucous membranes are normal. There is trismus in the jaw. No posterior oropharyngeal erythema.    Mild trismus.  Cardiovascular: Normal rate.   Pulmonary/Chest: Effort normal.  Musculoskeletal: Normal range of motion. He exhibits no edema or tenderness.  Neurological: He is alert and oriented to person, place, and time.  Skin: Skin is warm and dry. Capillary refill takes less than 2 seconds.  Psychiatric: He has a normal mood and affect. His behavior is normal.  Nursing note and vitals reviewed.    ED Treatments / Results  Labs (all labs ordered are listed, but only abnormal results are displayed) Labs Reviewed - No data to display  EKG  EKG Interpretation None       Radiology No results found.  Procedures Dental Block Date/Time: 09/20/2017 2:15 PM Performed by: Gwyneth Sprout Authorized by: Gwyneth Sprout   Consent:    Consent obtained:  Verbal   Consent given by:  Patient   Risks discussed:  Unsuccessful block and pain   Alternatives discussed:  No treatment Indications:    Indications:  dental abscess and dental pain   Location:    Anesthesia block type: apical block. Procedure details (see MAR for exact dosages):    Syringe type:  Luer lock syringe   Needle gauge:  27 G   Anesthetic injected:  Bupivacaine 0.5% WITH epi   Injection procedure:  Anatomic landmarks identified, introduced needle, incremental injection, negative aspiration for blood and anatomic landmarks palpated Post-procedure details:    Outcome:  Pain relieved   Patient tolerance of procedure:  Tolerated well, no immediate complications Comments:     11 blade syringe inserted but no pus expressed.  No packing placed.   (including critical  care time)  Medications Ordered in ED Medications  bupivacaine-epinephrine (MARCAINE W/ EPI) 0.5% -1:200000 injection 1.8 mL (not administered)     Initial Impression / Assessment and Plan / ED Course  I have reviewed the triage vital signs and the nursing notes.  Pertinent labs & imaging results that were available during my care of the patient were reviewed by me and considered in my medical decision making (see chart for details).     Pt with dental caries and facial swelling.  No signs of ludwig's angina or difficulty swallowing and no systemic symptoms. Will treat with PCN and have pt f/u with dentist.   Final Clinical Impressions(s) / ED Diagnoses   Final diagnoses:  Dental abscess  Facial swelling    New Prescriptions New Prescriptions   HYDROCODONE-ACETAMINOPHEN (NORCO/VICODIN) 5-325 MG TABLET    Take 1 tablet by mouth every 6 (six) hours as needed for severe pain.   PENICILLIN V POTASSIUM (VEETID) 250 MG TABLET    Take 1 tablet (250 mg total) by mouth 4 (four) times daily.     Gwyneth Sprout, MD 09/20/17 [phone]

## 2017-09-20 NOTE — ED Notes (Signed)
Pt ambulatory, stable, and expresses understanding of follow up appointments and d/c instructions.

## 2017-09-20 NOTE — Discharge Instructions (Signed)
Take aleve 2 times a day and if having break through pain then take pain meds

## 2018-04-27 ENCOUNTER — Other Ambulatory Visit: Payer: Self-pay

## 2018-04-27 ENCOUNTER — Emergency Department (HOSPITAL_COMMUNITY)
Admission: EM | Admit: 2018-04-27 | Discharge: 2018-04-27 | Disposition: A | Discharge status: Home / Self Care | Payer: Self-pay | Attending: Emergency Medicine | Admitting: Emergency Medicine

## 2018-04-27 ENCOUNTER — Encounter (HOSPITAL_COMMUNITY): Payer: Self-pay | Admitting: *Deleted

## 2018-04-27 ENCOUNTER — Emergency Department (HOSPITAL_COMMUNITY): Payer: Self-pay

## 2018-04-27 DIAGNOSIS — F17210 Nicotine dependence, cigarettes, uncomplicated: Secondary | ICD-10-CM | POA: Insufficient documentation

## 2018-04-27 DIAGNOSIS — I10 Essential (primary) hypertension: Secondary | ICD-10-CM | POA: Insufficient documentation

## 2018-04-27 DIAGNOSIS — G44009 Cluster headache syndrome, unspecified, not intractable: Secondary | ICD-10-CM | POA: Insufficient documentation

## 2018-04-27 MED ORDER — DIPHENHYDRAMINE HCL 50 MG/ML IJ SOLN
25.0000 mg | Freq: Once | INTRAMUSCULAR | Status: AC
  Administered 2018-04-27: 25 mg via INTRAVENOUS
  Filled 2018-04-27: qty 1

## 2018-04-27 MED ORDER — KETOROLAC TROMETHAMINE 30 MG/ML IJ SOLN
30.0000 mg | Freq: Once | INTRAMUSCULAR | Status: AC
  Administered 2018-04-27: 30 mg via INTRAVENOUS
  Filled 2018-04-27: qty 1

## 2018-04-27 MED ORDER — PROCHLORPERAZINE EDISYLATE 10 MG/2ML IJ SOLN
10.0000 mg | Freq: Once | INTRAMUSCULAR | Status: AC
  Administered 2018-04-27: 10 mg via INTRAVENOUS
  Filled 2018-04-27: qty 2

## 2018-04-27 NOTE — ED Notes (Signed)
Pt. Unable to sign due to computer dysfunction. Pt. Did not have any further questions.

## 2018-04-27 NOTE — ED Triage Notes (Signed)
The pt is c/o a headache for 3 days no n v  He has hypertension  He has not taken any med for years  He took aleve 5-6 hours ago

## 2018-04-27 NOTE — Discharge Instructions (Addendum)
You were seen today for headache.  It is most consistent with a cluster headache.  Your CT scan is negative.

## 2018-04-27 NOTE — ED Provider Notes (Signed)
[facility] EMERGENCY DEPARTMENT Provider Note   CSN: [id number] Arrival date & time: 04/27/18  0047     History   Chief Complaint Chief Complaint  Patient presents with  . Headache    HPI Joseph Brady is a 47 y.o. male.  HPI  This a 47 year old male with history of hypertension who presents with headache.  Patient reports 2 to 3-day history of right-sided headache.  He reports that it is dull and nonradiating.  Currently he rates his pain at 9 out of 10.  He took Aleve with only slight relief of pain.  Denies any fevers or neck stiffness.  Denies any significant history of headaches or migraines.  He does report some eye tearing on the right.  He denies any nausea or vomiting.  Does report photophobia.  Denies any weakness, numbness, tingling, speech difficulty.  Of note, patient's mother states that they have had "2 of her family members died of brain cancer in the last month."  They are worried about this.  Past Medical History:  Diagnosis Date  . H/O ETOH abuse   . Hypertension     Patient Active Problem List   Diagnosis Date Noted  . Closed fracture of left distal fibula 04/11/2017  . Alcohol abuse 12/14/2014  . Marijuana abuse 12/14/2014  . Substance induced mood disorder (HCC) 12/14/2014    History reviewed. No pertinent surgical history.      Home Medications    Prior to Admission medications   Medication Sig Start Date End Date Taking? Authorizing Provider  HYDROcodone-acetaminophen (NORCO/VICODIN) 5-325 MG tablet Take 1 tablet by mouth every 6 (six) hours as needed for severe pain. Patient not taking: Reported on 04/27/2018 09/20/17   Gwyneth Sprout, MD    Family History No family history on file.  Social History Social History   Tobacco Use  . Smoking status: Current Every Day Smoker    Packs/day: 0.25    Types: Cigarettes  . Smokeless tobacco: Never Used  Substance Use Topics  . Alcohol use: Yes    Alcohol/week: 8.4 oz      Types: 14 Cans of beer per week  . Drug use: No     Allergies   Penicillins   Review of Systems Review of Systems  Constitutional: Negative for fever.  Eyes: Positive for photophobia.  Respiratory: Negative for shortness of breath.   Cardiovascular: Negative for chest pain.  Gastrointestinal: Negative for abdominal pain, nausea and vomiting.  Genitourinary: Negative for dysuria.  Musculoskeletal: Negative for neck pain and neck stiffness.  Neurological: Positive for headaches. Negative for dizziness, weakness and numbness.  All other systems reviewed and are negative.    Physical Exam Updated Vital Signs BP (!) 185/116 (BP Location: Left Arm)   Pulse 99   Temp 98.8 F (37.1 C) (Oral)   Resp 20   Ht  (1.803 m)   Wt 90.7 kg (200 lb)   SpO2 99%   BMI 27.89 kg/m   Physical Exam  Constitutional: He is oriented to person, place, and time. He appears well-developed and well-nourished.  HENT:  Head: Normocephalic and atraumatic.  Eyes: Pupils are equal, round, and reactive to light. EOM are normal.  Neck: Normal range of motion. Neck supple.  Cardiovascular: Normal rate, regular rhythm and normal heart sounds.  No murmur heard. Pulmonary/Chest: Effort normal and breath sounds normal. No respiratory distress. He has no wheezes.  Abdominal: Soft. Bowel sounds are normal. There is no tenderness. There is no  rebound.  Musculoskeletal: He exhibits no edema.  Lymphadenopathy:    He has no cervical adenopathy.  Neurological: He is alert and oriented to person, place, and time.  5 out of 5 strength all 4 extremities, no dysmetria to finger-nose-finger, cranial nerves II through XII intact  Skin: Skin is warm and dry.  Psychiatric: He has a normal mood and affect.  Nursing note and vitals reviewed.    ED Treatments / Results  Labs (all labs ordered are listed, but only abnormal results are displayed) Labs Reviewed - No data to display  EKG None  Radiology Ct  Head Wo Contrast  Result Date: 04/27/2018 CLINICAL DATA:  Acute onset of right-sided headache. EXAM: CT HEAD WITHOUT CONTRAST TECHNIQUE: Contiguous axial images were obtained from the base of the skull through the vertex without intravenous contrast. COMPARISON:  None. FINDINGS: Brain: No evidence of acute infarction, hemorrhage, hydrocephalus, extra-axial collection or mass lesion/mass effect. Mild periventricular and subcortical white matter change likely reflects small vessel ischemic microangiopathy. A cavum septum pellucidum is noted. The posterior fossa, including the cerebellum, brainstem and fourth ventricle, is within normal limits. The third and lateral ventricles, and basal ganglia are unremarkable in appearance. The cerebral hemispheres are symmetric in appearance, with normal gray-white differentiation. No mass effect or midline shift is seen. Vascular: No hyperdense vessel or unexpected calcification. Skull: There is no evidence of fracture; there is mild chronic deformity of the right orbital floor. Sinuses/Orbits: The visualized portions of the orbits are within normal limits. The paranasal sinuses and mastoid air cells are well-aerated. Other: No significant soft tissue abnormalities are seen. IMPRESSION: 1. No acute intracranial pathology seen on CT. 2. Mild small vessel ischemic microangiopathy. Electronically Signed   By: Roanna Raider M.D.   On: 04/27/2018 05:57    Procedures Procedures (including critical care time)  Medications Ordered in ED Medications  diphenhydrAMINE (BENADRYL) injection 25 mg (25 mg Intravenous Given 04/27/18 0425)  prochlorperazine (COMPAZINE) injection 10 mg (10 mg Intravenous Given 04/27/18 0425)  ketorolac (TORADOL) 30 MG/ML injection 30 mg (30 mg Intravenous Given 04/27/18 0425)     Initial Impression / Assessment and Plan / ED Course  I have reviewed the triage vital signs and the nursing notes.  Pertinent labs & imaging results that were available  during my care of the patient were reviewed by me and considered in my medical decision making (see chart for details).     Patient presents with right-sided headache.  Ongoing for 2 to 3 days.  He is overall nontoxic-appearing vital signs notable for blood pressure of 185/116.  History of hypertension but does not take medications.  Patient and family are concerned for possible brain tumor.  His neurologic exam is normal I have low suspicion.  However, given recent family members with similar symptoms, will obtain CT to rule out mass.  Patient was given a migraine cocktail.  Low suspicion for subarachnoid hemorrhage.  Given right-sided symptoms and tearing, this could be cluster headaches.  On recheck, patient states he feels much better.  Headache is gone.  CT scan is negative.  Patient is reassured and will be discharged home.  After history, exam, and medical workup I feel the patient has been appropriately medically screened and is safe for discharge home. Pertinent diagnoses were discussed with the patient. Patient was given return precautions.   Final Clinical Impressions(s) / ED Diagnoses   Final diagnoses:  Cluster headache, not intractable, unspecified chronicity pattern    ED Discharge Orders  None       Shon Baton, MD 04/27/18 [phone]

## 2019-02-03 ENCOUNTER — Encounter (HOSPITAL_COMMUNITY): Payer: Self-pay

## 2019-02-03 ENCOUNTER — Emergency Department (HOSPITAL_COMMUNITY)
Admission: EM | Admit: 2019-02-03 | Discharge: 2019-02-03 | Disposition: A | Discharge status: Home / Self Care | Payer: Self-pay | Attending: Emergency Medicine | Admitting: Emergency Medicine

## 2019-02-03 ENCOUNTER — Other Ambulatory Visit: Payer: Self-pay

## 2019-02-03 DIAGNOSIS — I10 Essential (primary) hypertension: Secondary | ICD-10-CM | POA: Insufficient documentation

## 2019-02-03 DIAGNOSIS — K029 Dental caries, unspecified: Secondary | ICD-10-CM | POA: Insufficient documentation

## 2019-02-03 DIAGNOSIS — F17210 Nicotine dependence, cigarettes, uncomplicated: Secondary | ICD-10-CM | POA: Insufficient documentation

## 2019-02-03 MED ORDER — NAPROXEN 375 MG PO TABS: 375.0000 mg | ORAL_TABLET | Freq: Two times a day (BID) | ORAL | 0 refills | Status: AC

## 2019-02-03 MED ORDER — NAPROXEN 500 MG PO TABS
500.0000 mg | ORAL_TABLET | Freq: Once | ORAL | Status: AC
  Administered 2019-02-03: 500 mg via ORAL
  Filled 2019-02-03: qty 1

## 2019-02-03 MED ORDER — CLINDAMYCIN HCL 300 MG PO CAPS: 300.0000 mg | ORAL_CAPSULE | Freq: Four times a day (QID) | ORAL | 0 refills | Status: DC

## 2019-02-03 MED ORDER — CLINDAMYCIN HCL 300 MG PO CAPS
300.0000 mg | ORAL_CAPSULE | Freq: Once | ORAL | Status: AC
  Administered 2019-02-03: 300 mg via ORAL
  Filled 2019-02-03: qty 1

## 2019-02-03 MED ORDER — LIDOCAINE VISCOUS HCL 2 % MT SOLN
15.0000 mL | Freq: Once | OROMUCOSAL | Status: AC
  Administered 2019-02-03: 15 mL via OROMUCOSAL
  Filled 2019-02-03: qty 15

## 2019-02-03 NOTE — ED Triage Notes (Signed)
Pt reports R lower dental pain and swelling x 2 days. Airway patent.

## 2019-02-03 NOTE — ED Provider Notes (Signed)
[facility]-EMERGENCY DEPT Provider Note   CSN: [id number] Arrival date & time: 02/03/19  0104    History   Chief Complaint Chief Complaint  Patient presents with  . Dental Pain    HPI Joseph Brady is a 48 y.o. male.     The history is provided by the patient.  Dental Pain  Location:  Lower Lower teeth location:  31/RL 2nd molar Quality:  Aching Severity:  Severe Onset quality:  Gradual Duration:  2 days Timing:  Constant Progression:  Worsening Chronicity:  Recurrent Context: dental caries   Previous work-up:  Dental exam Relieved by:  Nothing Worsened by:  Nothing Ineffective treatments:  None tried Associated symptoms: no congestion, no drooling, no facial swelling, no fever, no neck pain, no neck swelling, no oral bleeding and no trismus   Risk factors: smoking   Risk factors: no alcohol problem     Past Medical History:  Diagnosis Date  . H/O ETOH abuse   . Hypertension     Patient Active Problem List   Diagnosis Date Noted  . Closed fracture of left distal fibula 04/11/2017  . Alcohol abuse 12/14/2014  . Marijuana abuse 12/14/2014  . Substance induced mood disorder (HCC) 12/14/2014    History reviewed. No pertinent surgical history.      Home Medications    Prior to Admission medications   Medication Sig Start Date End Date Taking? Authorizing Provider  naproxen sodium (ALEVE) 220 MG tablet Take 440 mg by mouth 2 (two) times daily as needed (pain).   Yes [provider]  HYDROcodone-acetaminophen (NORCO/VICODIN) 5-325 MG tablet Take 1 tablet by mouth every 6 (six) hours as needed for severe pain. Patient not taking: Reported on 04/27/2018 09/20/17   Gwyneth Sprout, MD    Family History History reviewed. No pertinent family history.  Social History Social History   Tobacco Use  . Smoking status: Current Every Day Smoker    Packs/day: 0.25    Types: Cigarettes  . Smokeless tobacco: Never Used    Substance Use Topics  . Alcohol use: Yes    Alcohol/week: 14.0 standard drinks    Types: 14 Cans of beer per week  . Drug use: No     Allergies   Penicillins   Review of Systems Review of Systems  Constitutional: Negative for fever.  HENT: Positive for dental problem. Negative for congestion, drooling and facial swelling.   Respiratory: Negative for shortness of breath.   Musculoskeletal: Negative for neck pain.  All other systems reviewed and are negative.    Physical Exam Updated Vital Signs BP (!) 164/119 (BP Location: Left Arm)   Pulse (!) 108   Temp 98.6 F (37 C) (Oral)   Resp 18   SpO2 100%   Physical Exam Vitals signs and nursing note reviewed.  Constitutional:      Appearance: Normal appearance.  HENT:     Head: Normocephalic and atraumatic.     Nose: Nose normal.     Mouth/Throat:     Mouth: Mucous membranes are moist. No angioedema.     Dentition: Abnormal dentition. Dental caries present.     Tongue: No lesions.     Pharynx: Oropharynx is clear. No uvula swelling.   Neck:     Musculoskeletal: Normal range of motion and neck supple.  Cardiovascular:     Rate and Rhythm: Normal rate and regular rhythm.     Pulses: Normal pulses.     Heart sounds: Normal heart sounds.  Pulmonary:     Effort: Pulmonary effort is normal.     Breath sounds: Normal breath sounds.  Abdominal:     General: Abdomen is flat. Bowel sounds are normal.     Tenderness: There is no abdominal tenderness. There is no guarding.  Musculoskeletal: Normal range of motion.  Skin:    General: Skin is warm and dry.     Capillary Refill: Capillary refill takes less than 2 seconds.  Neurological:     General: No focal deficit present.     Mental Status: He is alert and oriented to person, place, and time.  Psychiatric:        Mood and Affect: Mood normal.        Behavior: Behavior normal.      ED Treatments / Results  Labs (all labs ordered are listed, but only abnormal  results are displayed) Labs Reviewed - No data to display  EKG None  Radiology No results found.  Procedures Procedures (including critical care time)  Medications Ordered in ED Medications  clindamycin (CLEOCIN) capsule 300 mg (has no administration in time range)  naproxen (NAPROSYN) tablet 500 mg (has no administration in time range)  lidocaine (XYLOCAINE) 2 % viscous mouth solution 15 mL (has no administration in time range)      Final Clinical Impressions(s) / ED Diagnoses   Return for pain, intractable cough, fevers >100.4 unrelieved by medication, shortness of breath, intractable vomiting, chest pain, shortness of breath, weakness numbness, changes in speech, facial asymmetry,abdominal pain, passing out,Inability to tolerate liquids or food, cough, altered mental status or any concerns. No signs of systemic illness or infection. The patient is nontoxic-appearing on exam and vital signs are within normal limits.   I have reviewed the triage vital signs and the nursing notes. Pertinent labs &imaging results that were available during my care of the patient were reviewed by me and considered in my medical decision making (see chart for details).  After history, exam, and medical workup I feel the patient has been appropriately medically screened and is safe for discharge home. Pertinent diagnoses were discussed with the patient. Patient was given return precautions.   Eliel Dudding, MD 02/03/19 [phone]

## 2019-06-01 ENCOUNTER — Ambulatory Visit (HOSPITAL_COMMUNITY)
Admission: EM | Admit: 2019-06-01 | Discharge: 2019-06-01 | Disposition: A | Discharge status: Home / Self Care | Payer: Self-pay | Attending: Internal Medicine | Admitting: Internal Medicine

## 2019-06-01 ENCOUNTER — Encounter (HOSPITAL_COMMUNITY): Payer: Self-pay | Admitting: *Deleted

## 2019-06-01 ENCOUNTER — Other Ambulatory Visit: Payer: Self-pay

## 2019-06-01 DIAGNOSIS — K047 Periapical abscess without sinus: Secondary | ICD-10-CM

## 2019-06-01 MED ORDER — IBUPROFEN 800 MG PO TABS
800.0000 mg | ORAL_TABLET | Freq: Once | ORAL | Status: AC
  Administered 2019-06-01: 09:00:00 800 mg via ORAL

## 2019-06-01 MED ORDER — IBUPROFEN 800 MG PO TABS
ORAL_TABLET | ORAL | Status: AC
  Filled 2019-06-01: qty 1

## 2019-06-01 MED ORDER — CLINDAMYCIN HCL 150 MG PO CAPS: 450.0000 mg | ORAL_CAPSULE | Freq: Three times a day (TID) | ORAL | 0 refills | Status: AC

## 2019-06-01 NOTE — ED Triage Notes (Signed)
C/O recurring right lower dental abscess x 2 days.  Now with right facial swelling.  Denies fevers.

## 2019-06-01 NOTE — Discharge Instructions (Signed)
Ibuprofen or aleve as needed for pain. Ice application.  Please follow up with your primary care provider for recheck of your BP.  Please follow up with dentist for definitive treatment to prevent recurrence.

## 2019-06-01 NOTE — ED Provider Notes (Signed)
[facility]    CSN: [id number] Arrival date & time: 06/01/19  [id number]      History  [id number] Chief Complaint Chief Complaint  Patient presents with  . Abscess    HPI Joseph RickerJames L Brady is a 48 y.o. male.   Joseph Brady presents with complaints of right lower jaw pain and swelling which started three days ago, worse yesterday. Pain has improved today but still with swelling. No difficulty opening jaw or swallowing. No fevers. States has had similar in the past, last was approximately 4 months ago. Has had the affected tooth removed, three years ago, but feels that the underlying gum is what becomes painful and swollen. Doesn't follow with a dentist or PCP. Denies any current pain. Not taking any prescribed medications.     ROS per HPI, negative if not otherwise mentioned.      Past Medical History:  Diagnosis Date  . H/O ETOH abuse   . Hypertension     Patient Active Problem List   Diagnosis Date Noted  . Closed fracture of left distal fibula 04/11/2017  . Alcohol abuse 12/14/2014  . Marijuana abuse 12/14/2014  . Substance induced mood disorder (HCC) 12/14/2014    History reviewed. No pertinent surgical history.     Home Medications    Prior to Admission medications   Medication Sig Start Date End Date Taking? Authorizing Provider  clindamycin (CLEOCIN) 150 MG capsule Take 3 capsules (450 mg total) by mouth 3 (three) times daily for 7 days. 06/01/19 06/08/19  Georgetta HaberBurky,  B, NP  HYDROcodone-acetaminophen (NORCO/VICODIN) 5-325 MG tablet Take 1 tablet by mouth every 6 (six) hours as needed for severe pain. Patient not taking: Reported on 04/27/2018 09/20/17   Gwyneth SproutPlunkett, Whitney, MD  naproxen (NAPROSYN) 375 MG tablet Take 1 tablet (375 mg total) by mouth 2 (two) times daily with a meal. 02/03/19   Palumbo, April, MD  naproxen sodium (ALEVE) 220 MG tablet Take 440 mg by mouth 2 (two) times daily as needed (pain).    [provider]    Family History  Family History  Problem Relation Age of Onset  . Cancer Sister     Social History Social History   Tobacco Use  . Smoking status: Current Every Day Smoker    Packs/day: 0.25    Types: Cigarettes  . Smokeless tobacco: Never Used  Substance Use Topics  . Alcohol use: Yes    Comment: occasionally  . Drug use: No     Allergies   Penicillins   Review of Systems Review of Systems   Physical Exam Triage Vital Signs ED Triage Vitals [06/01/19 0836]  Enc Vitals Group     BP (!) 157/107     Pulse Rate 80     Resp 16     Temp 98.3 F (36.8 C)     Temp Source Oral     SpO2 97 %     Weight      Height      Head Circumference      Peak Flow      Pain Score 0     Pain Loc      Pain Edu?      Excl. in GC?    No data found.  Updated Vital Signs BP (!) 157/107 Comment: Has not been compliant with meds x "years"  Pulse 80   Temp 98.3 F (36.8 C) (Oral)   Resp 16   SpO2 97%    Physical Exam Constitutional:  Appearance: He is well-developed.  HENT:     Head:     Jaw: Trismus and tenderness present. No pain on movement or malocclusion.      Comments: Tenderness along jaw line at tooth #27 and #28, with tooth removed; soft tissue overlying right lower wisdom tooth as well with some tenderness; mild right facial swelling noted Cardiovascular:     Rate and Rhythm: Normal rate and regular rhythm.  Pulmonary:     Effort: Pulmonary effort is normal.     Breath sounds: Normal breath sounds.  Skin:    General: Skin is warm and dry.  Neurological:     Mental Status: He is alert and oriented to person, place, and time.      UC Treatments / Results  Labs (all labs ordered are listed, but only abnormal results are displayed) Labs Reviewed - No data to display  EKG None  Radiology No results found.  Procedures Procedures (including critical care time)  Medications Ordered in UC Medications  ibuprofen (ADVIL) tablet 800 mg (800 mg Oral Given 06/01/19  0856)  ibuprofen (ADVIL) 800 MG tablet (has no administration in time range)    Initial Impression / Assessment and Plan / UC Course  I have reviewed the triage vital signs and the nursing notes.  Pertinent labs & imaging results that were available during my care of the patient were reviewed by me and considered in my medical decision making (see chart for details).     Antibiotics provided. Pain well controlled at this time. Encouraged dental follow up for definitive treatment. Noted htn, encouraged follow up with PCP for recheck and likely needs management. Patient verbalized understanding and agreeable to plan.    Final Clinical Impressions(s) / UC Diagnoses   Final diagnoses:  Dental abscess     Discharge Instructions     Ibuprofen or aleve as needed for pain. Ice application.  Please follow up with your primary care provider for recheck of your BP.  Please follow up with dentist for definitive treatment to prevent recurrence.    ED Prescriptions    Medication Sig Dispense Auth. Provider   clindamycin (CLEOCIN) 150 MG capsule Take 3 capsules (450 mg total) by mouth 3 (three) times daily for 7 days. 63 capsule Zigmund Gottron, NP     Controlled Substance Prescriptions [place] Controlled Substance Registry consulted? Not Applicable   Zigmund Gottron, NP 06/01/19 [phone]

## 2019-09-04 ENCOUNTER — Ambulatory Visit
Admission: RE | Admit: 2019-09-04 | Discharge: 2019-09-04 | Disposition: A | Discharge status: Home / Self Care | Payer: No Typology Code available for payment source | Source: Ambulatory Visit | Attending: Internal Medicine | Admitting: Internal Medicine

## 2019-09-04 ENCOUNTER — Other Ambulatory Visit: Payer: Self-pay | Admitting: Internal Medicine

## 2019-09-04 ENCOUNTER — Other Ambulatory Visit: Payer: Self-pay

## 2019-09-04 DIAGNOSIS — A184 Tuberculosis of skin and subcutaneous tissue: Secondary | ICD-10-CM

## 2020-05-19 ENCOUNTER — Encounter (HOSPITAL_COMMUNITY): Payer: Self-pay

## 2020-05-19 ENCOUNTER — Other Ambulatory Visit: Payer: Self-pay

## 2020-05-19 ENCOUNTER — Emergency Department (HOSPITAL_COMMUNITY)
Admission: EM | Admit: 2020-05-19 | Discharge: 2020-05-19 | Disposition: A | Discharge status: Home / Self Care | Payer: No Typology Code available for payment source | Attending: Emergency Medicine | Admitting: Emergency Medicine

## 2020-05-19 DIAGNOSIS — Z5321 Procedure and treatment not carried out due to patient leaving prior to being seen by health care provider: Secondary | ICD-10-CM | POA: Insufficient documentation

## 2020-05-19 DIAGNOSIS — X58XXXA Exposure to other specified factors, initial encounter: Secondary | ICD-10-CM | POA: Insufficient documentation

## 2020-05-19 DIAGNOSIS — S60352A Superficial foreign body of left thumb, initial encounter: Secondary | ICD-10-CM | POA: Insufficient documentation

## 2020-05-19 DIAGNOSIS — Y939 Activity, unspecified: Secondary | ICD-10-CM | POA: Insufficient documentation

## 2020-05-19 DIAGNOSIS — Y999 Unspecified external cause status: Secondary | ICD-10-CM | POA: Insufficient documentation

## 2020-05-19 DIAGNOSIS — Y929 Unspecified place or not applicable: Secondary | ICD-10-CM | POA: Insufficient documentation

## 2020-05-19 NOTE — ED Triage Notes (Signed)
Pt arrives POV for eval of piece of metal in L thumb x 2 months. Pt reports he just needs the piece of metal out

## 2020-05-19 NOTE — ED Notes (Signed)
Pt stated that he could not wait this long in the waiting room and that he was going to leave. This NT encouraged the pt to stay but pt stated he would come back if he needed to.

## 2021-04-27 ENCOUNTER — Encounter (HOSPITAL_BASED_OUTPATIENT_CLINIC_OR_DEPARTMENT_OTHER): Payer: Self-pay

## 2021-04-27 ENCOUNTER — Telehealth (HOSPITAL_BASED_OUTPATIENT_CLINIC_OR_DEPARTMENT_OTHER): Payer: Self-pay | Admitting: *Deleted

## 2021-04-27 ENCOUNTER — Emergency Department (HOSPITAL_BASED_OUTPATIENT_CLINIC_OR_DEPARTMENT_OTHER)
Admission: EM | Admit: 2021-04-27 | Discharge: 2021-04-27 | Disposition: A | Discharge status: Home / Self Care | Payer: Self-pay | Attending: Emergency Medicine | Admitting: Emergency Medicine

## 2021-04-27 ENCOUNTER — Emergency Department (HOSPITAL_BASED_OUTPATIENT_CLINIC_OR_DEPARTMENT_OTHER): Payer: Self-pay | Admitting: Radiology

## 2021-04-27 ENCOUNTER — Other Ambulatory Visit: Payer: Self-pay

## 2021-04-27 DIAGNOSIS — R0789 Other chest pain: Secondary | ICD-10-CM | POA: Insufficient documentation

## 2021-04-27 DIAGNOSIS — I10 Essential (primary) hypertension: Secondary | ICD-10-CM | POA: Insufficient documentation

## 2021-04-27 DIAGNOSIS — F17210 Nicotine dependence, cigarettes, uncomplicated: Secondary | ICD-10-CM | POA: Insufficient documentation

## 2021-04-27 DIAGNOSIS — H53149 Visual discomfort, unspecified: Secondary | ICD-10-CM | POA: Insufficient documentation

## 2021-04-27 LAB — CBC WITH DIFFERENTIAL/PLATELET
Abs Immature Granulocytes: 0.02 10*3/uL (ref 0.00–0.07)
Basophils Absolute: 0 10*3/uL (ref 0.0–0.1)
Basophils Relative: 0 %
Eosinophils Absolute: 0 10*3/uL (ref 0.0–0.5)
Eosinophils Relative: 0 %
HCT: 49.2 % (ref 39.0–52.0)
Hemoglobin: 16.7 g/dL (ref 13.0–17.0)
Immature Granulocytes: 0 %
Lymphocytes Relative: 23 %
Lymphs Abs: 1.7 10*3/uL (ref 0.7–4.0)
MCH: 32.2 pg (ref 26.0–34.0)
MCHC: 33.9 g/dL (ref 30.0–36.0)
MCV: 94.8 fL (ref 80.0–100.0)
Monocytes Absolute: 0.5 10*3/uL (ref 0.1–1.0)
Monocytes Relative: 7 %
Neutro Abs: 5.1 10*3/uL (ref 1.7–7.7)
Neutrophils Relative %: 70 %
Platelets: 185 10*3/uL (ref 150–400)
RBC: 5.19 MIL/uL (ref 4.22–5.81)
RDW: 12.8 % (ref 11.5–15.5)
WBC: 7.3 10*3/uL (ref 4.0–10.5)
nRBC: 0 % (ref 0.0–0.2)

## 2021-04-27 LAB — TROPONIN I (HIGH SENSITIVITY)
Troponin I (High Sensitivity): 4 ng/L (ref <18)
Troponin I (High Sensitivity): 5 ng/L (ref <18)

## 2021-04-27 LAB — BASIC METABOLIC PANEL
Anion gap: 11 (ref 5–15)
BUN: 9 mg/dL (ref 6–20)
CO2: 23 mmol/L (ref 22–32)
Calcium: 9.3 mg/dL (ref 8.9–10.3)
Chloride: 103 mmol/L (ref 98–111)
Creatinine, Ser: 0.86 mg/dL (ref 0.61–1.24)
GFR, Estimated: >60 mL/min (ref >60)
Glucose, Bld: 103 mg/dL — ABNORMAL HIGH (ref 70–99)
Potassium: 3.6 mmol/L (ref 3.5–5.1)
Sodium: 137 mmol/L (ref 135–145)

## 2021-04-27 LAB — D-DIMER, QUANTITATIVE: D-Dimer, Quant: <0.27 ug/mL-FEU (ref 0.00–0.50)

## 2021-04-27 MED ORDER — AMLODIPINE BESYLATE 5 MG PO TABS: 5.0000 mg | ORAL_TABLET | Freq: Every day | ORAL | 3 refills | Status: DC

## 2021-04-27 NOTE — ED Provider Notes (Signed)
[facility] EMERGENCY DEPT Provider Note   CSN: [id number] Arrival date & time: 04/27/21  1212     History Chief Complaint  Patient presents with  . Chest Pain    Joseph Brady is a 50 y.o. male.  Patient is a 50 year old male with a history of hypertension that has been untreated for years, daily alcohol use, marijuana and tobacco use who is presenting today with sudden onset of right-sided chest pain that started around 730 at work today.  Patient does do a lot of lifting at work but reports it is not heavy.  When the pain started it was sharp in nature.  It does not radiate.  It is worse with taking a deep breath and certain movements of his arm.  The pain has been persistent since that time and is still present now with taking a deep breath.  He denies any shortness of breath, nausea, vomiting, diaphoresis, abdominal pain.  He did recall a few days ago he was having pain in his right calf but denies having any swelling and reports the pain is not there now.  He has had no recent injury.  No immobilization or recent surgeries.  An aunt with a history of blood clots but no one else in his family.  A sister who has had a stent and heart disease but nobody else.  He denies any cough, congestion or URI type symptoms.  Eating does not seem to make the pain worse.  The history is provided by the patient.  Chest Pain      Past Medical History:  Diagnosis Date  . H/O ETOH abuse   . Hypertension     Patient Active Problem List   Diagnosis Date Noted  . Closed fracture of left distal fibula 04/11/2017  . Alcohol abuse 12/14/2014  . Marijuana abuse 12/14/2014  . Substance induced mood disorder (HCC) 12/14/2014    History reviewed. No pertinent surgical history.     Family History  Problem Relation Age of Onset  . Cancer Sister     Social History   Tobacco Use  . Smoking status: Current Every Day Smoker    Packs/day: 0.25    Types: Cigarettes  . Smokeless  tobacco: Never Used  Vaping Use  . Vaping Use: Never used  Substance Use Topics  . Alcohol use: Yes    Comment: occasionally  . Drug use: No    Home Medications Prior to Admission medications   Medication Sig Start Date End Date Taking? Authorizing Provider  HYDROcodone-acetaminophen (NORCO/VICODIN) 5-325 MG tablet Take 1 tablet by mouth every 6 (six) hours as needed for severe pain. Patient not taking: Reported on 04/27/2018 09/20/17   Gwyneth Sprout, MD  naproxen (NAPROSYN) 375 MG tablet Take 1 tablet (375 mg total) by mouth 2 (two) times daily with a meal. 02/03/19   Palumbo, April, MD  naproxen sodium (ALEVE) 220 MG tablet Take 440 mg by mouth 2 (two) times daily as needed (pain).    [provider]    Allergies    Penicillins  Review of Systems   Review of Systems  Cardiovascular: Positive for chest pain.  All other systems reviewed and are negative.   Physical Exam Updated Vital Signs BP (!) 169/104   Pulse 68   Temp 98.7 F (37.1 C) (Oral)   Resp 14   Ht 5\' 10"  (1.778 m)   Wt 93.9 kg   SpO2 100%   BMI 29.70 kg/m   Physical Exam Vitals  and nursing note reviewed.  Constitutional:      General: He is not in acute distress.    Appearance: He is well-developed.  HENT:     Head: Normocephalic and atraumatic.  Eyes:     General:        Right eye: No discharge.        Left eye: No discharge.     Conjunctiva/sclera: Conjunctivae normal.     Pupils: Pupils are equal, round, and reactive to light.     Comments: photophobia  Neck:     Meningeal: Brudzinski's sign and Kernig's sign absent.  Cardiovascular:     Rate and Rhythm: Normal rate.     Heart sounds: Normal heart sounds. No murmur heard.   Pulmonary:     Effort: Pulmonary effort is normal. No respiratory distress.     Breath sounds: Normal breath sounds. No wheezing or rales.  Chest:     Chest wall: Tenderness present.    Abdominal:     General: There is no distension.     Palpations:  Abdomen is soft.     Tenderness: There is no abdominal tenderness.  Musculoskeletal:        General: No tenderness. Normal range of motion.     Cervical back: Normal range of motion and neck supple. No rigidity. No spinous process tenderness.     Right lower leg: No edema.     Left lower leg: No edema.  Lymphadenopathy:     Cervical: No cervical adenopathy.  Skin:    General: Skin is warm and dry.  Neurological:     Mental Status: He is alert and oriented to person, place, and time. Mental status is at baseline.     GCS: GCS eye subscore is 4. GCS verbal subscore is 5. GCS motor subscore is 6.     Cranial Nerves: No cranial nerve deficit.     Sensory: No sensory deficit.     Coordination: Coordination normal.     Gait: Gait normal.  Psychiatric:        Mood and Affect: Mood normal.        Behavior: Behavior normal.     ED Results / Procedures / Treatments   Labs (all labs ordered are listed, but only abnormal results are displayed) Labs Reviewed  BASIC METABOLIC PANEL - Abnormal; Notable for the following components:      Result Value   Glucose, Bld 103 (*)    All other components within normal limits  CBC WITH DIFFERENTIAL/PLATELET  D-DIMER, QUANTITATIVE  TROPONIN I (HIGH SENSITIVITY)  TROPONIN I (HIGH SENSITIVITY)    EKG EKG Interpretation  Date/Time:  Monday Apr 27 2021 12:20:41 EDT Ventricular Rate:  92 PR Interval:  152 QRS Duration: 104 QT Interval:  364 QTC Calculation: 450 R Axis:   48 Text Interpretation: Normal sinus rhythm Minimal voltage criteria for LVH, may be normal variant ( Cornell product ) Borderline ECG No significant change from prior ecg, no STEMI Confirmed by Alvester Chou [phone]) on 04/27/2021 12:23:36 PM   Radiology DG Chest 2 View  Result Date: 04/27/2021 CLINICAL DATA:  Chest pain today. EXAM: CHEST - 2 VIEW COMPARISON:  09/04/2019 FINDINGS: The cardiac silhouette, mediastinal and hilar contours are normal. The lungs are clear. No  pleural effusion or pulmonary lesions. The bony thorax is intact. IMPRESSION: No acute cardiopulmonary findings. Electronically Signed   By: Rudie Meyer M.D.   On: 04/27/2021 13:05    Procedures Procedures   Medications Ordered in  ED Medications - No data to display  ED Course  I have reviewed the triage vital signs and the nursing notes.  Pertinent labs & imaging results that were available during my care of the patient were reviewed by me and considered in my medical decision making (see chart for details).    MDM Rules/Calculators/A&P                          Patient is a 50 year old male presenting today with nonspecific chest pain.  Patient's pain is on the right side, reproducible and pleuritic.  Most likely musculoskeletal in nature given patient's symptoms and exam.  He denies any URI symptoms, cough but did report a painful right calf 2 days ago which is now resolved.  Low risk Wells but given that history we will do a D-dimer.  Patient CBC, BMP, initial troponin are all within normal limits.  EKG with voltage changes consistent with LVH but no other acute findings.  It is not different from prior EKGs.  Patient is hypertensive here but has been off blood pressure medication for over a year and does not have a regular doctor.  Low suspicion for ACS, dissection, myocarditis, pericarditis, GERD.  Low suspicion for pneumothorax, pneumonia.  Chest x-ray within normal limits.  5:30 PM Delta troponin was normal, D-dimer within normal limits.  Feel that patient's chest pain is musculoskeletal in nature.  He will use Tylenol and Voltaren gel.  Patient also started on blood pressure medication given persistent hypertension and no prior PCP.  We will also have transition of care contact patient for PCP follow-up.  MDM Number of Diagnoses or Management Options   Amount and/or Complexity of Data Reviewed Clinical lab tests: ordered and reviewed Tests in the radiology section of CPT:  ordered and reviewed Tests in the medicine section of CPT: ordered and reviewed Independent visualization of images, tracings, or specimens: yes    Final Clinical Impression(s) / ED Diagnoses Final diagnoses:  Chest wall pain    Rx / DC Orders ED Discharge Orders         Ordered    amLODipine (NORVASC) 5 MG tablet  Daily        04/27/21 1729           Gwyneth Sprout, MD 04/27/21 1731

## 2021-04-27 NOTE — ED Triage Notes (Signed)
Pt c/o CP to the R side of his chest that started while he was at work. Pt states the pain is worse with certain movement. Pt reports some associated ShOB. Denies nausea.

## 2021-04-27 NOTE — ED Notes (Signed)
Patient verbalizes understanding of discharge instructions. Opportunity for questioning and answers were provided. Armband removed by staff, pt discharged from ED.  

## 2021-04-27 NOTE — ED Notes (Signed)
Labs obtained per MD order. No complications with lab draw.

## 2021-04-27 NOTE — Discharge Instructions (Addendum)
Get Voltaren gel over-the-counter and you can rub it on the area of your chest that is sore.  Also take 2 extra strength Tylenol every 6 hours.  Also you were given a prescription for blood pressure medication that you take once a day. Blood work today looks good.  The blood test to the heart were normal and no signs of blood clot.  Kidney function was normal today as well

## 2021-04-27 NOTE — Social Work (Addendum)
CSW made appointment for Pt for ED follow up at [facility]  June 21, 8:30am.  CSW contacted Pt via phone to update Pt as well as sent text at Pt request so that Pt could have information at hand.  CSW also update Pt home phone in chart.

## 2021-04-27 NOTE — Care Management (Signed)
Consult received to assist with PCP needs, patient is uninsured and needs to establish care for chronic condition.  Information forwarded to [facility]. Scheduler will reach out to patient to arrange a ED follow up appt. Updated EDP.

## 2021-06-02 ENCOUNTER — Inpatient Hospital Stay (INDEPENDENT_AMBULATORY_CARE_PROVIDER_SITE_OTHER): Payer: Self-pay | Admitting: Primary Care

## 2021-06-10 ENCOUNTER — Encounter: Payer: Self-pay | Admitting: Internal Medicine

## 2021-08-24 ENCOUNTER — Other Ambulatory Visit: Payer: Self-pay | Admitting: *Deleted

## 2021-08-24 ENCOUNTER — Other Ambulatory Visit: Payer: Self-pay

## 2021-08-24 DIAGNOSIS — Z125 Encounter for screening for malignant neoplasm of prostate: Secondary | ICD-10-CM

## 2021-08-24 NOTE — Progress Notes (Signed)
Patient: Joseph Brady           Date of Birth: 02-07-1971           MRN: [id number] Visit Date: 08/24/2021 PCP: Patient, No Pcp Per (Inactive)  Prostate Cancer Screening Date of last physical exam:  (date not given) Date of last rectal exam:  (date not given) Have you ever had any of the following?: None Have you ever had or been told you have an allergy to latex products?: No Are you currently taking any natural prostate preparations?: No Are you currently experiencing any urinary symptoms?: No  Prostate Exam Exam not completed. PSA Only.  Patient's History Patient Active Problem List   Diagnosis Date Noted   Closed fracture of left distal fibula 04/11/2017   Alcohol abuse 12/14/2014   Marijuana abuse 12/14/2014   Substance induced mood disorder (HCC) 12/14/2014   Past Medical History:  Diagnosis Date   H/O ETOH abuse    Hypertension     Family History  Problem Relation Age of Onset   Cancer Sister     Social History   Occupational History   Not on file  Tobacco Use   Smoking status: Every Day    Packs/day: 0.25    Types: Cigarettes   Smokeless tobacco: Never  Vaping Use   Vaping Use: Never used  Substance and Sexual Activity   Alcohol use: Yes    Comment: occasionally   Drug use: No   Sexual activity: Not on file

## 2021-08-25 ENCOUNTER — Telehealth: Payer: Self-pay

## 2021-08-25 LAB — PSA: Prostate Specific Ag, Serum: 1.3 ng/mL (ref 0.0–4.0)

## 2021-08-25 NOTE — Telephone Encounter (Addendum)
Patient returned call and was informed normal PSA results. Patient verbalized understanding.  Attempted to contact patient regarding lab results(PSA). Left message on voicemail requesting return call. 

## 2022-01-20 ENCOUNTER — Encounter (HOSPITAL_COMMUNITY): Payer: Self-pay | Admitting: Emergency Medicine

## 2022-01-20 ENCOUNTER — Emergency Department (HOSPITAL_COMMUNITY)
Admission: EM | Admit: 2022-01-20 | Discharge: 2022-01-20 | Discharge status: Against Medical Advice | Payer: Self-pay | Attending: Student | Admitting: Student

## 2022-01-20 DIAGNOSIS — Z5321 Procedure and treatment not carried out due to patient leaving prior to being seen by health care provider: Secondary | ICD-10-CM | POA: Insufficient documentation

## 2022-01-20 DIAGNOSIS — R519 Headache, unspecified: Secondary | ICD-10-CM | POA: Insufficient documentation

## 2022-01-20 LAB — CBC WITH DIFFERENTIAL/PLATELET
Abs Immature Granulocytes: 0.04 10*3/uL (ref 0.00–0.07)
Basophils Absolute: 0 10*3/uL (ref 0.0–0.1)
Basophils Relative: 1 %
Eosinophils Absolute: 0 10*3/uL (ref 0.0–0.5)
Eosinophils Relative: 0 %
HCT: 44.1 % (ref 39.0–52.0)
Hemoglobin: 14.8 g/dL (ref 13.0–17.0)
Immature Granulocytes: 1 %
Lymphocytes Relative: 21 %
Lymphs Abs: 1.5 10*3/uL (ref 0.7–4.0)
MCH: 32.1 pg (ref 26.0–34.0)
MCHC: 33.6 g/dL (ref 30.0–36.0)
MCV: 95.7 fL (ref 80.0–100.0)
Monocytes Absolute: 0.6 10*3/uL (ref 0.1–1.0)
Monocytes Relative: 8 %
Neutro Abs: 5.2 10*3/uL (ref 1.7–7.7)
Neutrophils Relative %: 69 %
Platelets: 208 10*3/uL (ref 150–400)
RBC: 4.61 MIL/uL (ref 4.22–5.81)
RDW: 13.2 % (ref 11.5–15.5)
WBC: 7.4 10*3/uL (ref 4.0–10.5)
nRBC: 0 % (ref 0.0–0.2)

## 2022-01-20 LAB — BASIC METABOLIC PANEL
Anion gap: 7 (ref 5–15)
BUN: 7 mg/dL (ref 6–20)
CO2: 27 mmol/L (ref 22–32)
Calcium: 8.9 mg/dL (ref 8.9–10.3)
Chloride: 106 mmol/L (ref 98–111)
Creatinine, Ser: 0.81 mg/dL (ref 0.61–1.24)
GFR, Estimated: >60 mL/min (ref >60)
Glucose, Bld: 95 mg/dL (ref 70–99)
Potassium: 3.9 mmol/L (ref 3.5–5.1)
Sodium: 140 mmol/L (ref 135–145)

## 2022-01-20 NOTE — ED Notes (Signed)
Pt stated he had to leave due to an important phone call.

## 2022-01-20 NOTE — ED Triage Notes (Signed)
Patient here with complaint of hypertension and headache, states he has not taken his antihypertensive medications in two months because he ran out and does not have a PCP. Patient alert, oriented, ambulatory, and in no apparent distress at this time.

## 2022-01-20 NOTE — ED Provider Triage Note (Signed)
Emergency Medicine Provider Triage Evaluation Note  Joseph Brady , a 51 y.o. male  was evaluated in triage.  Pt complains of HTN and headache.  Patient reports he has been out of his blood pressure medication for 2 months, cannot remember what he took but reports it was a once daily pill, reports last night he started having a gradual progressive headache that is continued today.  No associated visual changes, numbness, weakness, dizziness or vomiting.  No associated chest pain or shortness of breath.  Has not been having headaches throughout the month since he ran out of his blood pressure medicines.  Review of Systems  Positive: Headache Negative: Chest pain, shortness of breath, vision changes, numbness, weakness, dizziness  Physical Exam  BP (!) 177/95 (BP Location: Right Arm)    Pulse 76    Temp 99.1 F (37.3 C) (Oral)    Resp 16    SpO2 100%  Gen:   Awake, no distress   Resp:  Normal effort  MSK:   Moves extremities without difficulty  Other:  No focal neurodeficits  Medical Decision Making  Medically screening exam initiated at 9:46 AM.  Appropriate orders placed.  Radonna Ricker was informed that the remainder of the evaluation will be completed by another provider, this initial triage assessment does not replace that evaluation, and the importance of remaining in the ED until their evaluation is complete.  Basic labs ordered to assess for potential endorgan damage, given that headache has been gradual and not sudden in onset and he has no associated deficits, low suspicion for bleed, will hold off on head CT at this time   Dartha Lodge, Cordelia Poche 01/20/22 1002

## 2022-07-25 IMAGING — DX DG CHEST 2V
2 series · 2 of 2 positions shown · non-contrast
Comparison: 09/04/2019

CLINICAL DATA: Chest pain today.

EXAM:
CHEST - 2 VIEW

[chest pa]
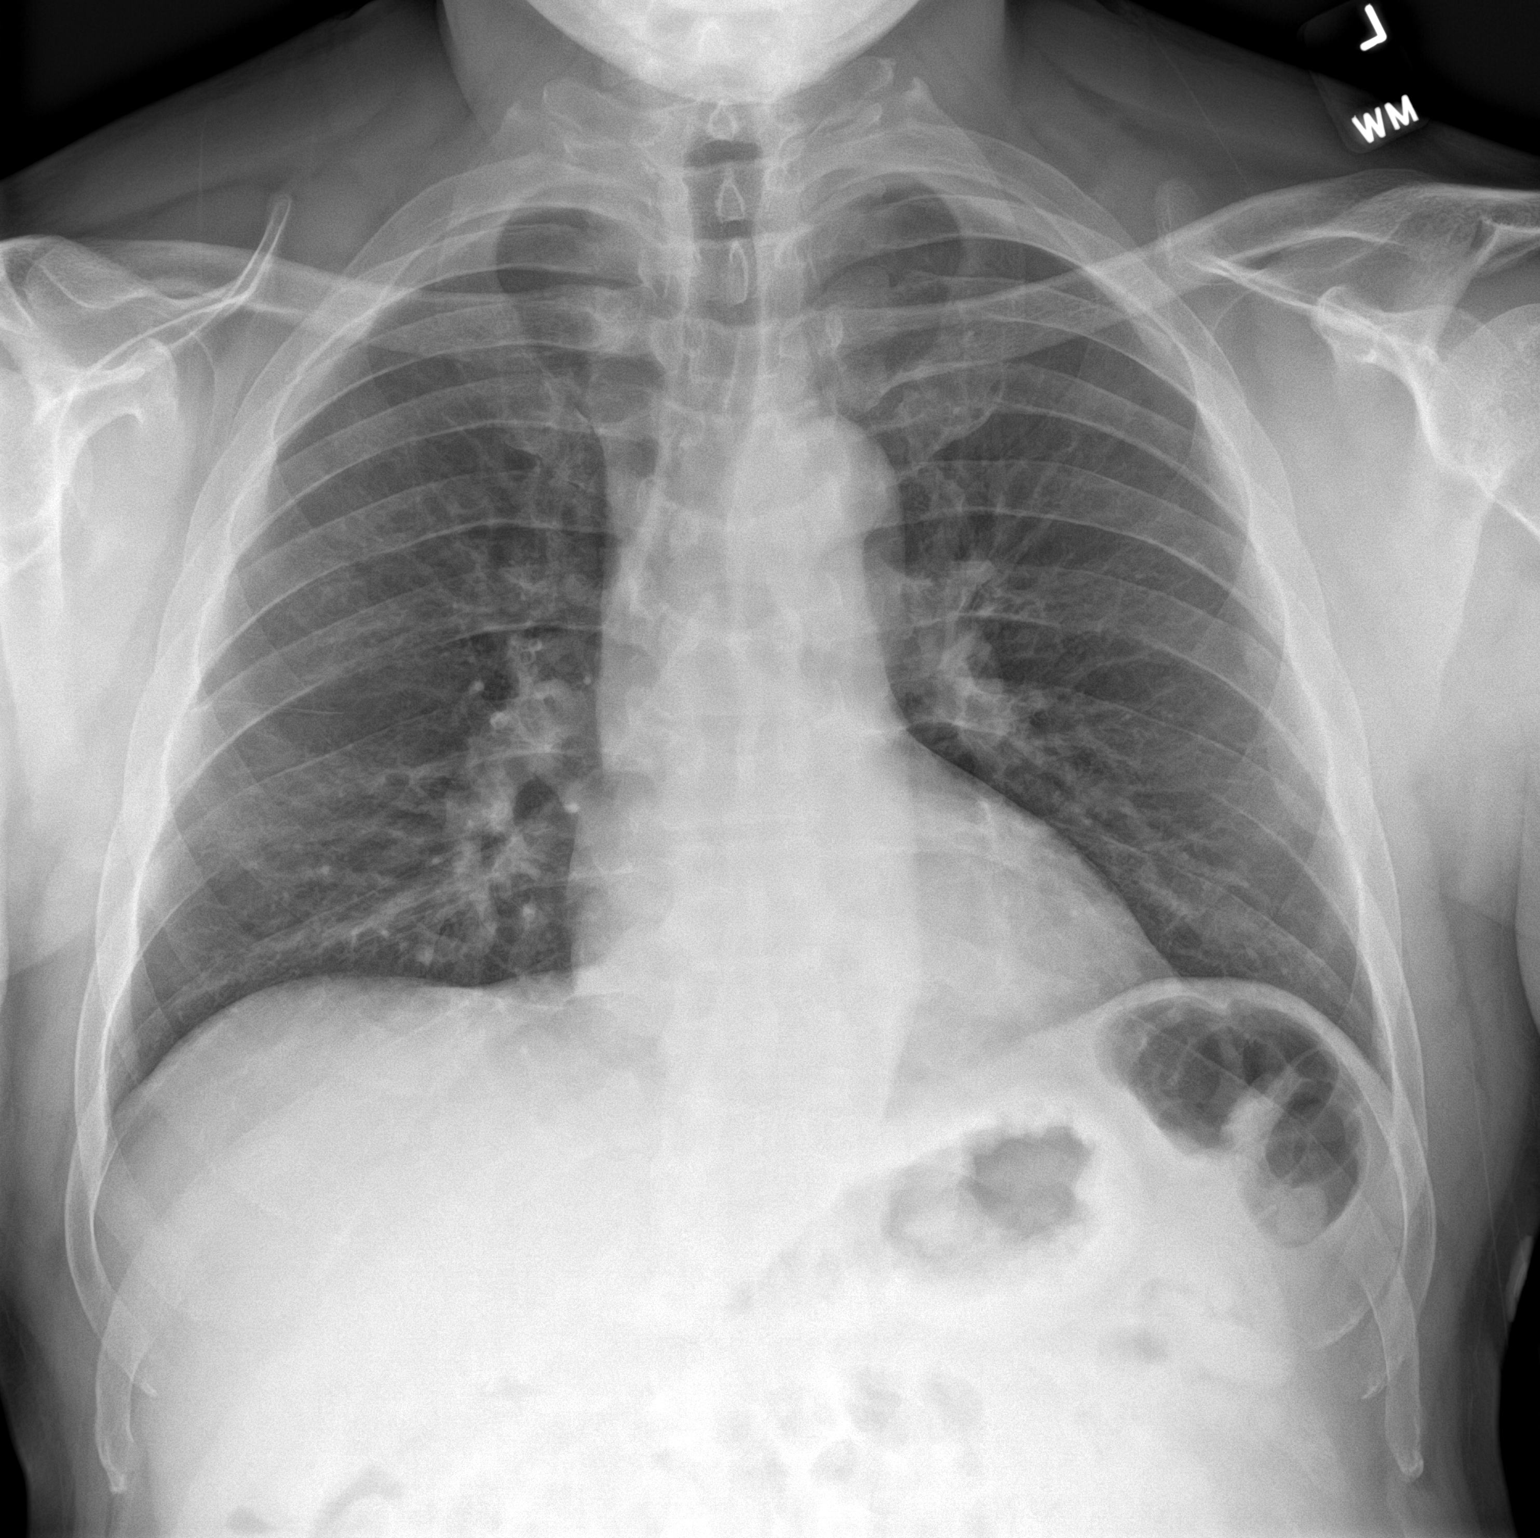

[chest lat]
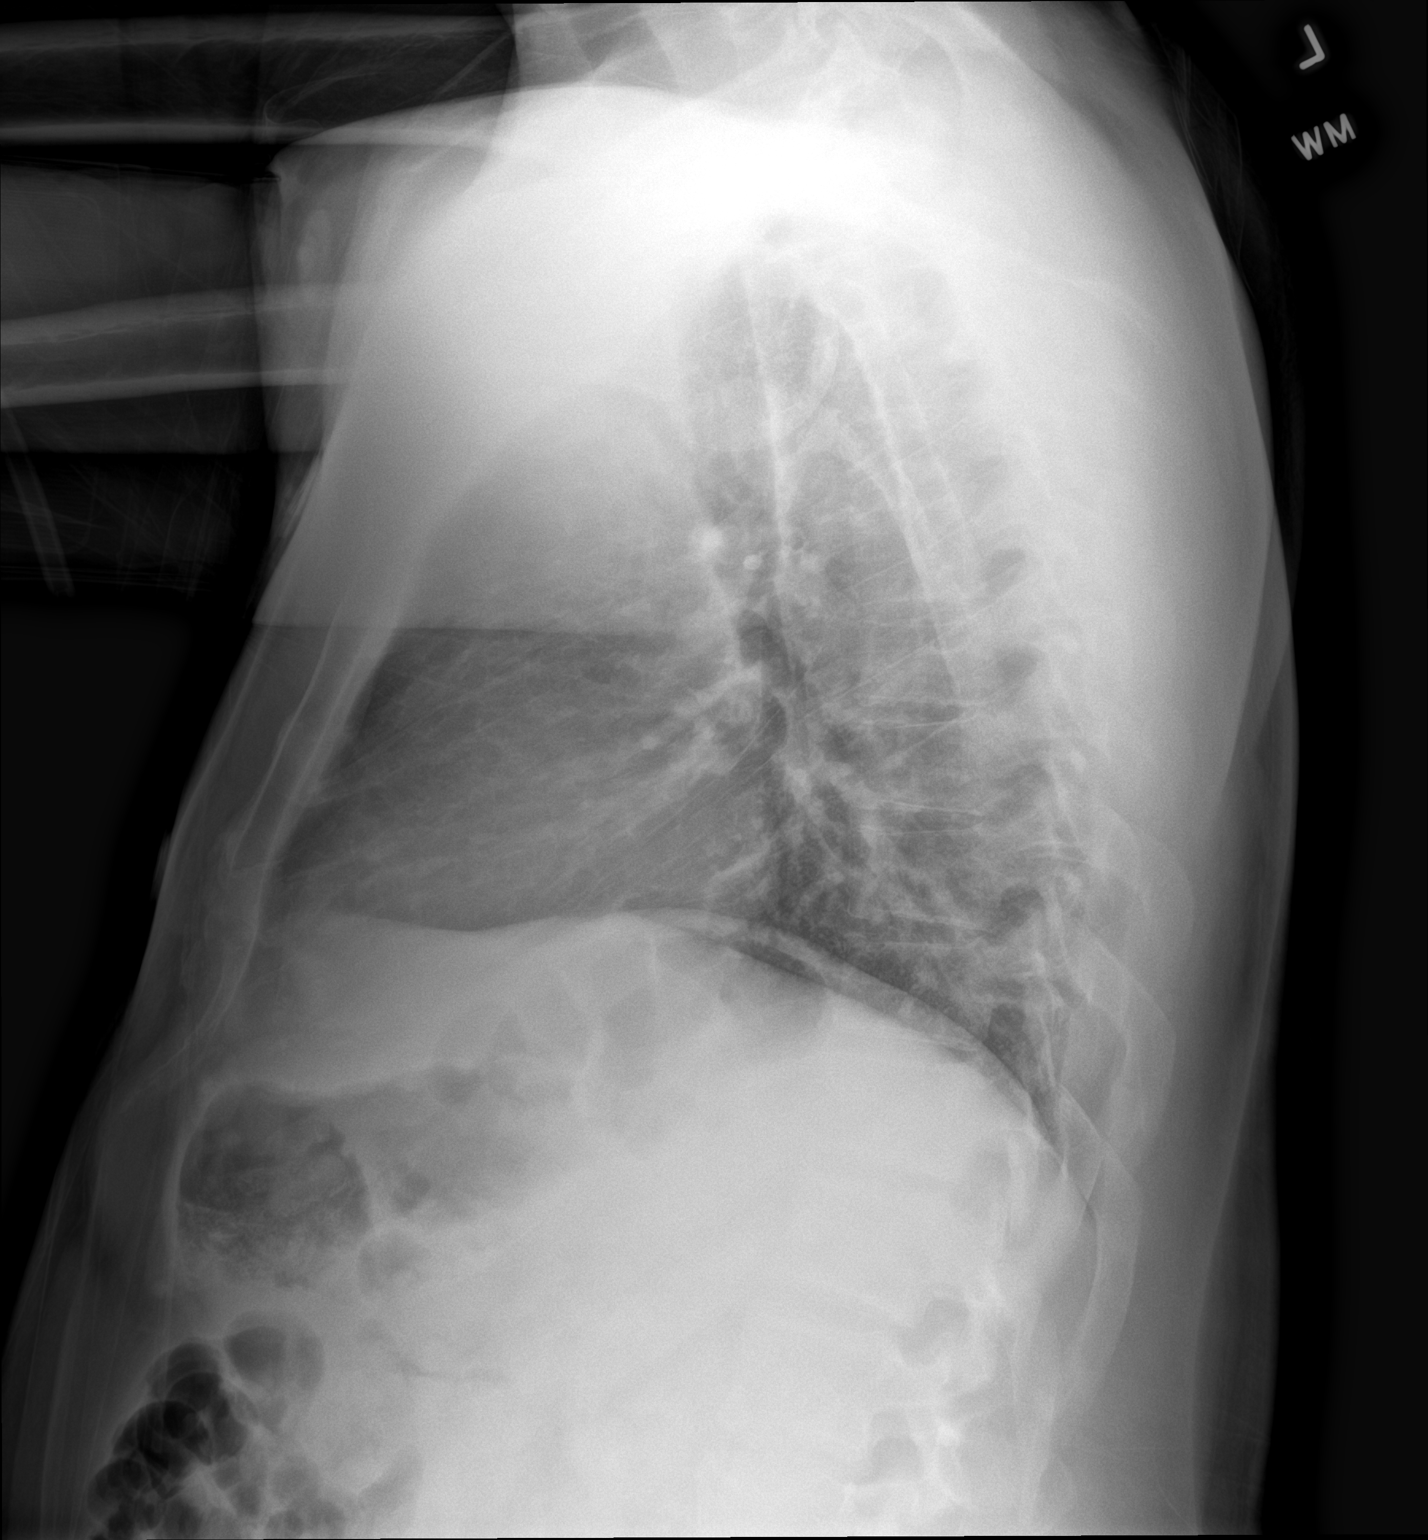

[2 of 2 positions shown; findings below may reference images not displayed]

FINDINGS: The cardiac silhouette, mediastinal and hilar contours are normal.
The lungs are clear. No pleural effusion or pulmonary lesions. The
bony thorax is intact.
IMPRESSION: No acute cardiopulmonary findings.

## 2022-07-30 ENCOUNTER — Encounter (HOSPITAL_COMMUNITY): Payer: Self-pay

## 2022-07-30 ENCOUNTER — Ambulatory Visit (HOSPITAL_COMMUNITY)
Admission: EM | Admit: 2022-07-30 | Discharge: 2022-07-30 | Disposition: A | Discharge status: Home / Self Care | Payer: Self-pay | Attending: Family Medicine | Admitting: Family Medicine

## 2022-07-30 DIAGNOSIS — M6530 Trigger finger, unspecified finger: Secondary | ICD-10-CM

## 2022-07-30 DIAGNOSIS — M65332 Trigger finger, left middle finger: Secondary | ICD-10-CM

## 2022-07-30 DIAGNOSIS — I10 Essential (primary) hypertension: Secondary | ICD-10-CM

## 2022-07-30 DIAGNOSIS — M79675 Pain in left toe(s): Secondary | ICD-10-CM

## 2022-07-30 MED ORDER — AMLODIPINE BESYLATE 5 MG PO TABS: 5.0000 mg | ORAL_TABLET | Freq: Every day | ORAL | 3 refills | Status: DC

## 2022-07-30 NOTE — ED Triage Notes (Signed)
Pt complains of pain in the left hand for 6 months and pain started to shoot down to the left foot x2days

## 2022-07-30 NOTE — ED Provider Notes (Signed)
[facility]    CSN: [id number] Arrival date & time: 07/30/22  0805      History   Chief Complaint Chief Complaint  Patient presents with   Hand Pain    Pt complains of left hand pain that shoots down to the left foot for 6 months for the hand and two days for the f left oot    HPI Joseph Brady is a 51 y.o. male.   Patient is here for 6-7 months of left middle finger locking up.  He is unable to really squeeze the hand/fingers as it locks up easily.   He is starting to have the same thing in the 2nd toe when he stands on it.    His bp is elevated today . He ran out of his medication a while ago.  He does not have insurance currently, kicks in in October.   Past Medical History:  Diagnosis Date   H/O ETOH abuse    Hypertension     Patient Active Problem List   Diagnosis Date Noted   Closed fracture of left distal fibula 04/11/2017   Alcohol abuse 12/14/2014   Marijuana abuse 12/14/2014   Substance induced mood disorder (HCC) 12/14/2014    History reviewed. No pertinent surgical history.     Home Medications    Prior to Admission medications   Medication Sig Start Date End Date Taking? Authorizing Provider  amLODipine (NORVASC) 5 MG tablet Take 1 tablet (5 mg total) by mouth daily. 04/27/21   Gwyneth Sprout, MD  HYDROcodone-acetaminophen (NORCO/VICODIN) 5-325 MG tablet Take 1 tablet by mouth every 6 (six) hours as needed for severe pain. Patient not taking: Reported on 04/27/2018 09/20/17   Gwyneth Sprout, MD  naproxen (NAPROSYN) 375 MG tablet Take 1 tablet (375 mg total) by mouth 2 (two) times daily with a meal. 02/03/19   Palumbo, April, MD  naproxen sodium (ALEVE) 220 MG tablet Take 440 mg by mouth 2 (two) times daily as needed (pain).    [provider]    Family History Family History  Problem Relation Age of Onset   Cancer Sister     Social History Social History   Tobacco Use   Smoking status: Every Day    Packs/day:  0.25    Types: Cigarettes   Smokeless tobacco: Never  Vaping Use   Vaping Use: Never used  Substance Use Topics   Alcohol use: Yes    Comment: occasionally   Drug use: No     Allergies   Penicillins   Review of Systems Review of Systems  Constitutional: Negative.   HENT: Negative.    Respiratory: Negative.    Cardiovascular: Negative.   Gastrointestinal: Negative.   Genitourinary: Negative.   Musculoskeletal:  Positive for myalgias.     Physical Exam Triage Vital Signs ED Triage Vitals  Enc Vitals Group     BP 07/30/22 0820 (!) 193/114     Pulse Rate 07/30/22 0820 92     Resp 07/30/22 0820 12     Temp 07/30/22 0820 98.8 F (37.1 C)     Temp Source 07/30/22 0820 Oral     SpO2 07/30/22 0820 98 %     Weight 07/30/22 0827 200 lb (90.7 kg)     Height 07/30/22 0827 5\' 10"  (1.778 m)     Head Circumference --      Peak Flow --      Pain Score 07/30/22 0826 9     Pain Loc --  Pain Edu? --      Excl. in GC? --    No data found.  Updated Vital Signs BP (!) 193/114 (BP Location: Right Arm)   Pulse 92   Temp 98.8 F (37.1 C) (Oral)   Resp 12   Ht 5\' 10"  (1.778 m)   Wt 90.7 kg   SpO2 98%   BMI 28.70 kg/m   Visual Acuity Right Eye Distance:   Left Eye Distance:   Bilateral Distance:    Right Eye Near:   Left Eye Near:    Bilateral Near:     Physical Exam HENT:     Head: Normocephalic.  Cardiovascular:     Rate and Rhythm: Normal rate and regular rhythm.  Pulmonary:     Effort: Pulmonary effort is normal.     Breath sounds: Normal breath sounds.  Musculoskeletal:     Comments: TTP at the left palm at the base of the 3rd finger;  locking of the 3rd finger with extension of the fingers;  He has TTP to the base of the left 2nd toe;   No other deformity or swelling noted  Skin:    General: Skin is warm.  Neurological:     General: No focal deficit present.     Mental Status: He is alert.  Psychiatric:        Mood and Affect: Mood normal.       UC Treatments / Results  Labs (all labs ordered are listed, but only abnormal results are displayed) Labs Reviewed - No data to display  EKG   Radiology No results found.  Procedures Procedures (including critical care time)  Medications Ordered in UC Medications - No data to display  Initial Impression / Assessment and Plan / UC Course  I have reviewed the triage vital signs and the nursing notes.  Pertinent labs & imaging results that were available during my care of the patient were reviewed by me and considered in my medical decision making (see chart for details).    Final Clinical Impressions(s) / UC Diagnoses   Final diagnoses:  Trigger finger, acquired  Toe pain, left  Essential hypertension     Discharge Instructions      You were seen today for trigger finger, as well as foot pain.  You should see a hand specialist for this.  You may call [facility] at [phone] to make and appointment for this.  You may take tylenol for pain of your finger and toe.  I have given you a post op shoe for comfort of the foot/toe.  I have refilled your blood pressure medication.  You should follow up with a primary care provider.  Please go to [email or web address] to find a primary care provider for yourself.     ED Prescriptions     Medication Sig Dispense Auth. Provider   amLODipine (NORVASC) 5 MG tablet Take 1 tablet (5 mg total) by mouth daily. 30 tablet , MD      PDMP not reviewed this encounter.   Jannifer Franklin, MD 07/30/22 [phone]

## 2022-07-30 NOTE — Discharge Instructions (Addendum)
You were seen today for trigger finger, as well as foot pain.  You should see a hand specialist for this.  You may call [facility] at [phone] to make and appointment for this.  You may take tylenol for pain of your finger and toe.  I have given you a post op shoe for comfort of the foot/toe.  I have refilled your blood pressure medication.  You should follow up with a primary care provider.  Please go to [email or web address] to find a primary care provider for yourself.

## 2022-09-08 ENCOUNTER — Other Ambulatory Visit: Payer: Self-pay

## 2022-09-08 DIAGNOSIS — Z1211 Encounter for screening for malignant neoplasm of colon: Secondary | ICD-10-CM

## 2022-09-13 ENCOUNTER — Ambulatory Visit: Payer: Self-pay

## 2022-10-24 ENCOUNTER — Ambulatory Visit (HOSPITAL_COMMUNITY)
Admission: EM | Admit: 2022-10-24 | Discharge: 2022-10-24 | Disposition: A | Discharge status: Home / Self Care | Payer: Self-pay | Attending: Physician Assistant | Admitting: Physician Assistant

## 2022-10-24 ENCOUNTER — Encounter (HOSPITAL_COMMUNITY): Payer: Self-pay | Admitting: Emergency Medicine

## 2022-10-24 DIAGNOSIS — Z1152 Encounter for screening for COVID-19: Secondary | ICD-10-CM | POA: Insufficient documentation

## 2022-10-24 DIAGNOSIS — J029 Acute pharyngitis, unspecified: Secondary | ICD-10-CM | POA: Insufficient documentation

## 2022-10-24 DIAGNOSIS — I10 Essential (primary) hypertension: Secondary | ICD-10-CM | POA: Insufficient documentation

## 2022-10-24 LAB — POCT RAPID STREP A, ED / UC: Streptococcus, Group A Screen (Direct): NEGATIVE

## 2022-10-24 MED ORDER — AMLODIPINE BESYLATE 5 MG PO TABS: 5.0000 mg | ORAL_TABLET | Freq: Every day | ORAL | 1 refills | Status: DC

## 2022-10-24 NOTE — ED Provider Notes (Signed)
[facility]    CSN: [id number] Arrival date & time: 10/24/22  1511      History   Chief Complaint Chief Complaint  Patient presents with   Covid Exposure    HPI Joseph Brady is a 51 y.o. male.   51 year old male presents for COVID screening and blood pressure medication refill.  Patient indicates that he has had several members in the household test positive for COVID over the past several days.  Patient indicates that today he woke up having sore throat and painful swallowing.  He does indicate he had mild rhinitis over the past 2 days.  He indicates he is not having any sinus pain or pressure, cough or chest congestion, fever or chills.  He is concerned and request for COVID test to be performed. Patient also indicates that he has high blood pressure.  He requests a refill on his amlodipine 5 mg once daily.  He indicates he has not been able to take the medicine for the past week as he needs a refill.  He indicates the present time he does not have a PCP.  He indicates that he does well on amlodipine and does not have any problems or side effects.     Past Medical History:  Diagnosis Date   H/O ETOH abuse    Hypertension     Patient Active Problem List   Diagnosis Date Noted   Closed fracture of left distal fibula 04/11/2017   Alcohol abuse 12/14/2014   Marijuana abuse 12/14/2014   Substance induced mood disorder (HCC) 12/14/2014    History reviewed. No pertinent surgical history.     Home Medications    Prior to Admission medications   Medication Sig Start Date End Date Taking? Authorizing Provider  amLODipine (NORVASC) 5 MG tablet Take 1 tablet (5 mg total) by mouth daily. 10/24/22   Ellsworth Lennox, PA-C  naproxen (NAPROSYN) 375 MG tablet Take 1 tablet (375 mg total) by mouth 2 (two) times daily with a meal. 02/03/19   Palumbo, April, MD  naproxen sodium (ALEVE) 220 MG tablet Take 440 mg by mouth 2 (two) times daily as needed (pain).    [provider]    Family History Family History  Problem Relation Age of Onset   Cancer Sister     Social History Social History   Tobacco Use   Smoking status: Every Day    Packs/day: 0.25    Types: Cigarettes   Smokeless tobacco: Never  Vaping Use   Vaping Use: Never used  Substance Use Topics   Alcohol use: Yes    Comment: occasionally   Drug use: No     Allergies   Penicillins   Review of Systems Review of Systems  HENT:  Positive for sore throat.      Physical Exam Triage Vital Signs ED Triage Vitals [10/24/22 1605]  Enc Vitals Group     BP (!) 189/87     Pulse Rate 70     Resp 17     Temp 98.4 F (36.9 C)     Temp Source Oral     SpO2 98 %     Weight      Height      Head Circumference      Peak Flow      Pain Score 0     Pain Loc      Pain Edu?      Excl. in GC?    No data found.  Updated Vital Signs BP (!) 189/87 (BP Location: Right Arm) Comment: ran out of BP medications.  Pulse 70   Temp 98.4 F (36.9 C) (Oral)   Resp 17   SpO2 98%   Visual Acuity Right Eye Distance:   Left Eye Distance:   Bilateral Distance:    Right Eye Near:   Left Eye Near:    Bilateral Near:     Physical Exam Constitutional:      Appearance: Normal appearance.  HENT:     Right Ear: Tympanic membrane and ear canal normal.     Left Ear: Tympanic membrane and ear canal normal.     Mouth/Throat:     Mouth: Mucous membranes are moist.     Pharynx: Posterior oropharyngeal erythema present. No oropharyngeal exudate.  Cardiovascular:     Rate and Rhythm: Normal rate and regular rhythm.     Heart sounds: Normal heart sounds.  Pulmonary:     Effort: Pulmonary effort is normal.     Breath sounds: Normal breath sounds and air entry. No wheezing, rhonchi or rales.  Lymphadenopathy:     Cervical: No cervical adenopathy.  Neurological:     Mental Status: He is alert.      UC Treatments / Results  Labs (all labs ordered are listed, but only abnormal  results are displayed) Labs Reviewed  SARS CORONAVIRUS 2 (TAT 6-24 HRS)  POCT RAPID STREP A, ED / UC    EKG   Radiology No results found.  Procedures Procedures (including critical care time)  Medications Ordered in UC Medications - No data to display  Initial Impression / Assessment and Plan / UC Course  I have reviewed the triage vital signs and the nursing notes.  Pertinent labs & imaging results that were available during my care of the patient were reviewed by me and considered in my medical decision making (see chart for details).    Plan: The acute pharyngitis will be treated with the following: A.  Advised take ibuprofen or Tylenol to relieve the throat pain. B.  Advised to use salt water gargles and lozenges to help soothe the throat. 2.  Screening for COVID-19 will be treated with the following: A.  Treatment will be modified depending on the results of the COVID test. 3.  The hypertension will be treated with the following: A.  Amlodipine 5 mg daily to treat the high blood pressure. 4.  Advised follow-up PCP or return to urgent care if symptoms fail to improve.  Final Clinical Impressions(s) / UC Diagnoses   Final diagnoses:  Acute pharyngitis, unspecified etiology  Encounter for screening for COVID-19  Essential hypertension     Discharge Instructions      Advise use Tylenol or ibuprofen to help relieve the pain of the sore throat. Advised to use salt water gargles and lozenges to help soothe the throat.  COVID test will be completed in 48 hours.  If you do not get a call from this office that indicates the test is negative.  Log onto MyChart to view the test results when it post in 48 hours.  You can become established with a PCP if you go on www.: [email or web address]  You can then choose the screen "primary care" and this will give you access to different practices that she can schedule an appointment.  Make sure to continue the amlodipine 5 mg  1 tablet daily to help control your blood pressure.     ED Prescriptions     Medication  Sig Dispense Auth. Provider   amLODipine (NORVASC) 5 MG tablet Take 1 tablet (5 mg total) by mouth daily. 90 tablet Ellsworth Lennox, PA-C      PDMP not reviewed this encounter.   Starsky, Nanna, PA-C 10/24/22 1648

## 2022-10-24 NOTE — ED Triage Notes (Addendum)
Pt reports recent exposure to covid. States people in his household tested positive. Reports he noticed his throat was hurting this morning. Denies other URI symptoms.    Requesting refill on BP medication. Amlodipine 5 mg.

## 2022-10-24 NOTE — Discharge Instructions (Signed)
Advise use Tylenol or ibuprofen to help relieve the pain of the sore throat. Advised to use salt water gargles and lozenges to help soothe the throat.  COVID test will be completed in 48 hours.  If you do not get a call from this office that indicates the test is negative.  Log onto MyChart to view the test results when it post in 48 hours.  You can become established with a PCP if you go on www.: [email or web address]  You can then choose the screen "primary care" and this will give you access to different practices that she can schedule an appointment.  Make sure to continue the amlodipine 5 mg 1 tablet daily to help control your blood pressure.

## 2022-10-25 LAB — SARS CORONAVIRUS 2 (TAT 6-24 HRS): SARS Coronavirus 2: NEGATIVE

## 2023-02-22 ENCOUNTER — Other Ambulatory Visit: Payer: Self-pay | Admitting: Internal Medicine

## 2023-02-23 LAB — LIPID PANEL
Cholesterol: 136 mg/dL (ref <200)
HDL: 44 mg/dL (ref >=40)
LDL Cholesterol (Calc): 63 mg/dL (calc)
Non-HDL Cholesterol (Calc): 92 mg/dL (calc) (ref <130)
Total CHOL/HDL Ratio: 3.1 (calc) (ref <5.0)
Triglycerides: 235 mg/dL — ABNORMAL HIGH (ref <150)

## 2023-02-23 LAB — CBC
HCT: 41.4 % (ref 38.5–50.0)
Hemoglobin: 14.5 g/dL (ref 13.2–17.1)
MCH: 31.1 pg (ref 27.0–33.0)
MCHC: 35 g/dL (ref 32.0–36.0)
MCV: 88.8 fL (ref 80.0–100.0)
MPV: 12.1 fL (ref 7.5–12.5)
Platelets: 192 10*3/uL (ref 140–400)
RBC: 4.66 10*6/uL (ref 4.20–5.80)
RDW: 13 % (ref 11.0–15.0)
WBC: 5.5 10*3/uL (ref 3.8–10.8)

## 2023-02-23 LAB — COMPLETE METABOLIC PANEL WITH GFR
AG Ratio: 1.4 (calc) (ref 1.0–2.5)
ALT: 15 U/L (ref 9–46)
AST: 15 U/L (ref 10–35)
Albumin: 4 g/dL (ref 3.6–5.1)
Alkaline phosphatase (APISO): 67 U/L (ref 35–144)
BUN: 11 mg/dL (ref 7–25)
CO2: 23 mmol/L (ref 20–32)
Calcium: 8.9 mg/dL (ref 8.6–10.3)
Chloride: 107 mmol/L (ref 98–110)
Creat: 0.89 mg/dL (ref 0.70–1.30)
Globulin: 2.8 g/dL (calc) (ref 1.9–3.7)
Glucose, Bld: 104 mg/dL — ABNORMAL HIGH (ref 65–99)
Potassium: 4.1 mmol/L (ref 3.5–5.3)
Sodium: 143 mmol/L (ref 135–146)
Total Bilirubin: 0.7 mg/dL (ref 0.2–1.2)
Total Protein: 6.8 g/dL (ref 6.1–8.1)
eGFR: 102 mL/min/{1.73_m2} (ref >=60)

## 2023-02-23 LAB — TESTOSTERONE, FREE & TOTAL

## 2023-02-23 LAB — TSH: TSH: 0.57 mIU/L (ref 0.40–4.50)

## 2023-02-23 LAB — VITAMIN D 25 HYDROXY (VIT D DEFICIENCY, FRACTURES): Vit D, 25-Hydroxy: 9 ng/mL — ABNORMAL LOW (ref 30–100)

## 2023-02-23 LAB — PSA: PSA: 0.84 ng/mL (ref <=4.00)

## 2023-10-05 ENCOUNTER — Telehealth: Payer: Self-pay | Admitting: *Deleted

## 2023-10-05 ENCOUNTER — Emergency Department (HOSPITAL_COMMUNITY): Payer: 59

## 2023-10-05 ENCOUNTER — Encounter (HOSPITAL_COMMUNITY): Payer: Self-pay

## 2023-10-05 ENCOUNTER — Other Ambulatory Visit: Payer: Self-pay

## 2023-10-05 ENCOUNTER — Emergency Department (HOSPITAL_COMMUNITY)
Admission: EM | Admit: 2023-10-05 | Discharge: 2023-10-05 | Disposition: A | Discharge status: Home / Self Care | Payer: 59 | Attending: Emergency Medicine | Admitting: Emergency Medicine

## 2023-10-05 DIAGNOSIS — K760 Fatty (change of) liver, not elsewhere classified: Secondary | ICD-10-CM | POA: Diagnosis not present

## 2023-10-05 DIAGNOSIS — R319 Hematuria, unspecified: Secondary | ICD-10-CM | POA: Diagnosis not present

## 2023-10-05 DIAGNOSIS — R000 Tachycardia, unspecified: Secondary | ICD-10-CM | POA: Diagnosis not present

## 2023-10-05 DIAGNOSIS — R109 Unspecified abdominal pain: Secondary | ICD-10-CM | POA: Diagnosis not present

## 2023-10-05 DIAGNOSIS — R310 Gross hematuria: Secondary | ICD-10-CM | POA: Insufficient documentation

## 2023-10-05 DIAGNOSIS — R350 Frequency of micturition: Secondary | ICD-10-CM | POA: Insufficient documentation

## 2023-10-05 DIAGNOSIS — N3289 Other specified disorders of bladder: Secondary | ICD-10-CM | POA: Diagnosis not present

## 2023-10-05 LAB — URINALYSIS, ROUTINE W REFLEX MICROSCOPIC
Bilirubin Urine: NEGATIVE
Glucose, UA: NEGATIVE mg/dL
Ketones, ur: NEGATIVE mg/dL
Leukocytes,Ua: NEGATIVE
Nitrite: NEGATIVE
Protein, ur: NEGATIVE mg/dL
Specific Gravity, Urine: 1.004 — ABNORMAL LOW (ref 1.005–1.030)
pH: 5 (ref 5.0–8.0)

## 2023-10-05 LAB — CBC
HCT: 49.9 % (ref 39.0–52.0)
Hemoglobin: 16.8 g/dL (ref 13.0–17.0)
MCH: 31.2 pg (ref 26.0–34.0)
MCHC: 33.7 g/dL (ref 30.0–36.0)
MCV: 92.8 fL (ref 80.0–100.0)
Platelets: 223 10*3/uL (ref 150–400)
RBC: 5.38 MIL/uL (ref 4.22–5.81)
RDW: 13.7 % (ref 11.5–15.5)
WBC: 9.3 10*3/uL (ref 4.0–10.5)
nRBC: 0 % (ref 0.0–0.2)

## 2023-10-05 LAB — BASIC METABOLIC PANEL
Anion gap: 12 (ref 5–15)
BUN: 7 mg/dL (ref 6–20)
CO2: 22 mmol/L (ref 22–32)
Calcium: 9.1 mg/dL (ref 8.9–10.3)
Chloride: 102 mmol/L (ref 98–111)
Creatinine, Ser: 0.95 mg/dL (ref 0.61–1.24)
GFR, Estimated: >60 mL/min (ref >60)
Glucose, Bld: 126 mg/dL — ABNORMAL HIGH (ref 70–99)
Potassium: 3.7 mmol/L (ref 3.5–5.1)
Sodium: 136 mmol/L (ref 135–145)

## 2023-10-05 MED ORDER — TAMSULOSIN HCL 0.4 MG PO CAPS: 0.4000 mg | ORAL_CAPSULE | Freq: Every day | ORAL | 0 refills | Status: AC

## 2023-10-05 MED ORDER — AMLODIPINE BESYLATE 5 MG PO TABS: 5.0000 mg | ORAL_TABLET | Freq: Every day | ORAL | 1 refills | Status: AC

## 2023-10-05 NOTE — Telephone Encounter (Signed)
Pt called regarding pharmacy not filling his Rx.  RNCM called pharmacy to find that with pt insurance, the cost would be more at their pharmacy and that he will need to have his Rx switched to [facility].  RNCM educated pt on insurance policy and gained permission to call Rx in to [facility] near him.

## 2023-10-05 NOTE — ED Provider Notes (Signed)
[place] EMERGENCY DEPARTMENT AT [facility] Provider Note   CSN: [id number] Arrival date & time: 10/05/23  0205     History  Chief Complaint  Patient presents with   Hematuria    Joseph Brady is a 52 y.o. male.  The history is provided by the patient.  Hematuria  Joseph Brady is a 52 y.o. male who presents to the Emergency Department complaining of hematuria.  He presents to the emergency department for evaluation of hematuria that started on Saturday.  He describes his urine as having occasional drops of blood present.  This started after he squeezed the tip of his penis to keep himself from peeing before he reached the bathroom.  He states that it chronically he wakes up 4-5 times nightly to urinate and has had issues with urinary frequency.  He does not have dysuria.  He describes the hematuria is something that waxes and wanes.  No prior similar symptoms before Saturday.  He does report chronic abdominal bloating, unchanged from his baseline.  He has a history of hypertension but did run out of his medications about a month ago.  No fever, nausea, vomiting, chest pain.  He is not sexually active.  He does drink alcohol, use cocaine and marijuana.     Home Medications Prior to Admission medications   Medication Sig Start Date End Date Taking? Authorizing Provider  tamsulosin (FLOMAX) 0.4 MG CAPS capsule Take 1 capsule (0.4 mg total) by mouth daily. 10/05/23  Yes Tilden Fossa, MD  amLODipine (NORVASC) 5 MG tablet Take 1 tablet (5 mg total) by mouth daily. 10/05/23   Tilden Fossa, MD  naproxen (NAPROSYN) 375 MG tablet Take 1 tablet (375 mg total) by mouth 2 (two) times daily with a meal. 02/03/19   Palumbo, April, MD  naproxen sodium (ALEVE) 220 MG tablet Take 440 mg by mouth 2 (two) times daily as needed (pain).    [provider]      Allergies    Penicillins    Review of Systems   Review of Systems  Genitourinary:  Positive for hematuria.   All other systems reviewed and are negative.   Physical Exam Updated Vital Signs BP (!) 168/116   Pulse 88   Temp 98 F (36.7 C) (Oral)   Resp 16   Ht 5\' 10"  (1.778 m)   Wt 90.7 kg   SpO2 100%   BMI 28.70 kg/m  Physical Exam Vitals and nursing note reviewed.  Constitutional:      Appearance: He is well-developed.  HENT:     Head: Normocephalic and atraumatic.  Cardiovascular:     Rate and Rhythm: Regular rhythm. Tachycardia present.     Heart sounds: No murmur heard. Pulmonary:     Effort: Pulmonary effort is normal. No respiratory distress.     Breath sounds: Normal breath sounds.  Abdominal:     Palpations: Abdomen is soft.     Tenderness: There is no abdominal tenderness. There is no guarding or rebound.  Genitourinary:    Comments: There is no evidence of urethral trauma, no blood or discharge at the urethral meatus.  Nurse Shawn present as chaperone. Musculoskeletal:        General: No tenderness.  Skin:    General: Skin is warm and dry.  Neurological:     Mental Status: He is alert and oriented to person, place, and time.  Psychiatric:        Behavior: Behavior normal.  ED Results / Procedures / Treatments   Labs (all labs ordered are listed, but only abnormal results are displayed) Labs Reviewed  URINALYSIS, ROUTINE W REFLEX MICROSCOPIC - Abnormal; Notable for the following components:      Result Value   Specific Gravity, Urine 1.004 (*)    Hgb urine dipstick LARGE (*)    Bacteria, UA RARE (*)    All other components within normal limits  BASIC METABOLIC PANEL - Abnormal; Notable for the following components:   Glucose, Bld 126 (*)    All other components within normal limits  CBC    EKG None  Radiology CT Renal Stone Study  Result Date: 10/05/2023 CLINICAL DATA:  Abdominal/flank pain. EXAM: CT ABDOMEN AND PELVIS WITHOUT CONTRAST TECHNIQUE: Multidetector CT imaging of the abdomen and pelvis was performed following the standard protocol  without IV contrast. RADIATION DOSE REDUCTION: This exam was performed according to the departmental dose-optimization program which includes automated exposure control, adjustment of the mA and/or kV according to patient size and/or use of iterative reconstruction technique. COMPARISON:  02/02/2011 FINDINGS: Lower chest: No acute abnormality. Hepatobiliary: Hepatic steatosis. No focal liver abnormality. Gallbladder normal. No bile duct dilatation. Pancreas: Unremarkable. No pancreatic ductal dilatation or surrounding inflammatory changes. Spleen: Normal in size without focal abnormality. Adrenals/Urinary Tract: Normal adrenal glands. No nephrolithiasis, hydronephrosis or mass. Mild bilateral perinephric soft tissue stranding, nonspecific but similar to previous exam. No ureteral calculi identified bilaterally. The urinary bladder is partially decompressed with circumferential wall thickening. Stomach/Bowel: Stomach appears normal. The appendix is visualized and appears within normal limits. No bowel wall thickening, inflammation, or distension. Vascular/Lymphatic: Normal caliber of the abdominal aorta. No signs of abdominopelvic adenopathy. Reproductive: Prostate is unremarkable. Other: No abdominal wall hernia or abnormality. No abdominopelvic ascites. No pneumoperitoneum. Musculoskeletal: No acute or suspicious osseous findings. L4-5 degenerative disc disease. IMPRESSION: 1. No evidence for nephrolithiasis or hydronephrosis. 2. Circumferential wall thickening of the urinary bladder, which may reflect incomplete distension. Correlate for any clinical signs or symptoms of cystitis. 3. Hepatic steatosis. Electronically Signed   By: Signa Kell M.D.   On: 10/05/2023 05:54    Procedures Procedures    Medications Ordered in ED Medications - No data to display  ED Course/ Medical Decision Making/ A&P                                 Medical Decision Making Amount and/or Complexity of Data Reviewed Labs:  ordered. Radiology: ordered.  Risk Prescription drug management.   Patient with history of hypertension here for evaluation of hematuria.  UA does have a small amount of blood present, no evidence of infection.  No evidence of blood at the urethral meatus on examination.  Labs without significant electrolyte abnormality or CBC abnormality.  CT stone study was obtained, which is negative for obstructing stone or mass.  He does have some mild bladder thickening on CT scan, suspect this is overall related to underdistention.  In terms of his hematuria recommend that patient follow-up with urology for further evaluation.  In terms of his urinary frequency will provide a prescription for Flomax to see if this gives him some relief.  Of note patient was significantly hypertensive, tachycardic at time of ED arrival.  He does report using cocaine, drinking alcohol and being out of his blood pressure medications, suspect this was a factor as well as some anxiety.  On repeat assessment his heart rate significantly  improved and his blood pressure did improve but remained elevated.  Will prescribe a refill for his amlodipine.  Current clinical picture is not consistent with dissection.        Final Clinical Impression(s) / ED Diagnoses Final diagnoses:  Gross hematuria  Urinary frequency    Rx / DC Orders ED Discharge Orders          Ordered    amLODipine (NORVASC) 5 MG tablet  Daily        10/05/23 0400    tamsulosin (FLOMAX) 0.4 MG CAPS capsule  Daily        10/05/23 1610              Tilden Fossa, MD 10/05/23 0745

## 2023-10-05 NOTE — ED Triage Notes (Signed)
Hematuria x 4 days.   Says he went to the restroom and was pinching himself and afterwards has had blood when urinating since.
